# Patient Record
Sex: Female | Born: 1960 | Race: Black or African American | Hispanic: No | Marital: Single | Smoking: Never smoker
Health system: Southern US, Community
[De-identification: ages and names within clinical notes are randomized; demographics above are authoritative.]

## PROBLEM LIST (undated history)

## (undated) DIAGNOSIS — M1990 Unspecified osteoarthritis, unspecified site: Secondary | ICD-10-CM

## (undated) DIAGNOSIS — I10 Essential (primary) hypertension: Secondary | ICD-10-CM

## (undated) HISTORY — PX: SHOULDER SURGERY: SHX246

## (undated) HISTORY — DX: Essential (primary) hypertension: I10

## (undated) HISTORY — DX: Unspecified osteoarthritis, unspecified site: M19.90

---

## 1999-02-21 ENCOUNTER — Ambulatory Visit (HOSPITAL_COMMUNITY): Admission: RE | Admit: 1999-02-21 | Discharge: 1999-02-21 | Payer: Self-pay | Admitting: *Deleted

## 2002-01-15 ENCOUNTER — Emergency Department (HOSPITAL_COMMUNITY): Admission: EM | Admit: 2002-01-15 | Discharge: 2002-01-15 | Payer: Self-pay | Admitting: Emergency Medicine

## 2006-08-20 ENCOUNTER — Encounter: Admission: RE | Admit: 2006-08-20 | Discharge: 2006-09-23 | Payer: Self-pay | Admitting: Orthopedic Surgery

## 2006-10-15 ENCOUNTER — Encounter: Admission: RE | Admit: 2006-10-15 | Discharge: 2006-10-15 | Payer: Self-pay | Admitting: Obstetrics

## 2007-01-08 ENCOUNTER — Encounter: Admission: RE | Admit: 2007-01-08 | Discharge: 2007-02-26 | Payer: Self-pay | Admitting: Orthopedic Surgery

## 2007-05-14 ENCOUNTER — Encounter: Admission: RE | Admit: 2007-05-14 | Discharge: 2007-08-12 | Payer: Self-pay | Admitting: Orthopedic Surgery

## 2007-07-10 ENCOUNTER — Encounter: Admission: RE | Admit: 2007-07-10 | Discharge: 2007-07-30 | Payer: Self-pay | Admitting: Orthopedic Surgery

## 2007-08-01 ENCOUNTER — Encounter: Admission: RE | Admit: 2007-08-01 | Discharge: 2007-09-18 | Payer: Self-pay | Admitting: Orthopedic Surgery

## 2010-08-19 ENCOUNTER — Encounter: Payer: Self-pay | Admitting: Obstetrics & Gynecology

## 2010-08-20 ENCOUNTER — Encounter: Payer: Self-pay | Admitting: Obstetrics

## 2011-10-09 ENCOUNTER — Other Ambulatory Visit: Payer: Self-pay | Admitting: Physical Medicine and Rehabilitation

## 2011-10-09 DIAGNOSIS — G894 Chronic pain syndrome: Secondary | ICD-10-CM

## 2011-11-08 ENCOUNTER — Inpatient Hospital Stay: Admission: RE | Admit: 2011-11-08 | Payer: Self-pay | Source: Ambulatory Visit

## 2011-11-10 ENCOUNTER — Other Ambulatory Visit: Payer: Self-pay

## 2011-11-16 ENCOUNTER — Ambulatory Visit
Admission: RE | Admit: 2011-11-16 | Discharge: 2011-11-16 | Disposition: A | Discharge status: Home / Self Care | Payer: Medicaid Other | Source: Ambulatory Visit | Attending: Physical Medicine and Rehabilitation | Admitting: Physical Medicine and Rehabilitation

## 2011-11-16 DIAGNOSIS — G894 Chronic pain syndrome: Secondary | ICD-10-CM

## 2012-01-03 DIAGNOSIS — D509 Iron deficiency anemia, unspecified: Secondary | ICD-10-CM | POA: Insufficient documentation

## 2012-01-16 DIAGNOSIS — I10 Essential (primary) hypertension: Secondary | ICD-10-CM | POA: Insufficient documentation

## 2012-04-07 ENCOUNTER — Other Ambulatory Visit: Payer: Self-pay | Admitting: Orthopedic Surgery

## 2012-04-07 DIAGNOSIS — E041 Nontoxic single thyroid nodule: Secondary | ICD-10-CM

## 2012-04-10 ENCOUNTER — Ambulatory Visit
Admission: RE | Admit: 2012-04-10 | Discharge: 2012-04-10 | Disposition: A | Discharge status: Home / Self Care | Payer: Medicaid Other | Source: Ambulatory Visit | Attending: Orthopedic Surgery | Admitting: Orthopedic Surgery

## 2012-04-10 DIAGNOSIS — E041 Nontoxic single thyroid nodule: Secondary | ICD-10-CM

## 2013-01-15 DIAGNOSIS — M25511 Pain in right shoulder: Secondary | ICD-10-CM | POA: Insufficient documentation

## 2013-11-19 ENCOUNTER — Other Ambulatory Visit (HOSPITAL_COMMUNITY): Payer: Self-pay | Admitting: Orthopedic Surgery

## 2013-11-19 DIAGNOSIS — M7540 Impingement syndrome of unspecified shoulder: Secondary | ICD-10-CM

## 2014-07-15 ENCOUNTER — Emergency Department (HOSPITAL_COMMUNITY): Payer: Medicaid Other

## 2014-07-15 ENCOUNTER — Emergency Department (HOSPITAL_COMMUNITY)
Admission: EM | Admit: 2014-07-15 | Discharge: 2014-07-15 | Disposition: A | Discharge status: Home / Self Care | Payer: Medicaid Other | Attending: Emergency Medicine | Admitting: Emergency Medicine

## 2014-07-15 ENCOUNTER — Encounter (HOSPITAL_COMMUNITY): Payer: Self-pay | Admitting: Emergency Medicine

## 2014-07-15 DIAGNOSIS — W228XXA Striking against or struck by other objects, initial encounter: Secondary | ICD-10-CM | POA: Insufficient documentation

## 2014-07-15 DIAGNOSIS — Y9289 Other specified places as the place of occurrence of the external cause: Secondary | ICD-10-CM | POA: Insufficient documentation

## 2014-07-15 DIAGNOSIS — Y998 Other external cause status: Secondary | ICD-10-CM | POA: Diagnosis not present

## 2014-07-15 DIAGNOSIS — Y9389 Activity, other specified: Secondary | ICD-10-CM | POA: Diagnosis not present

## 2014-07-15 DIAGNOSIS — R609 Edema, unspecified: Secondary | ICD-10-CM

## 2014-07-15 DIAGNOSIS — S6992XA Unspecified injury of left wrist, hand and finger(s), initial encounter: Secondary | ICD-10-CM | POA: Insufficient documentation

## 2014-07-15 DIAGNOSIS — M25442 Effusion, left hand: Secondary | ICD-10-CM

## 2014-07-15 NOTE — ED Provider Notes (Signed)
CSN: [id number]     Arrival date & time 07/15/14  1834 History   First MD Initiated Contact with Patient 07/15/14 1859    This chart was scribed for non-physician practitioner, Kara MuttonMarissa Zasha Belleau, PA, working with Kara ChickMartha K Linker, MD by Kara Evans, ED Scribe. This patient was seen in room WTR7/WTR7 and the patient's care was started at 7:27 PM.  Chief Complaint  Patient presents with  . ring stuck on finger     since yesterday, unable to be removed, no injuries   HPI  PCP: No primary care provider on file. HPI Comments: Kara Evans is a 53 y.o. female with no known static and past medical history presenting to the emergency department with left ring finger swelling that started last night. Patient reports that she has a constant throbbing sensation identified to the left ring finger. Reported that she was trying to get her ring off secondary to the swelling but was unable to. Stated that she has tried cold water, oil, straining, dental floss without relief. Denied fall, injury, loss of sensation, changes to skin color.  PCP none  History reviewed. No pertinent past medical history. Past Surgical History  Procedure Laterality Date  . Shoulder surgery Right    No family history on file. History  Substance Use Topics  . Smoking status: Never Smoker   . Smokeless tobacco: Not on file  . Alcohol Use: Not on file   OB History    No data available     Review of Systems  Constitutional: Negative for fever and chills.  Musculoskeletal: Positive for joint swelling.       Swelling and pain of left ring finger  Neurological: Negative for numbness.  Psychiatric/Behavioral: Negative for confusion.   Allergies  Review of patient's allergies indicates no known allergies.  Home Medications   Prior to Admission medications   Not on File   Triage Vitals: BP 174/100 mmHg  Pulse 88  Temp(Src) 98.8 F (37.1 C) (Oral)  Resp 18  Ht 5\' 3"  (1.6 m)  Wt 154 lb (69.854 kg)  BMI 27.29  kg/m2  SpO2 98%  LMP 06/15/2014 Physical Exam  Constitutional: She is oriented to person, place, and time. She appears well-developed and well-nourished. No distress.  HENT:  Head: Normocephalic and atraumatic.  Mouth/Throat: Oropharynx is clear and moist. No oropharyngeal exudate.  Eyes: Conjunctivae and EOM are normal. Right eye exhibits no discharge. Left eye exhibits no discharge.  Neck: Normal range of motion. Neck supple. No tracheal deviation present.  Cardiovascular: Normal rate, regular rhythm and normal heart sounds.  Exam reveals no friction rub.   No murmur heard. Pulses:      Radial pulses are 2+ on the right side, and 2+ on the left side.  Pulmonary/Chest: Effort normal and breath sounds normal. No respiratory distress. She has no wheezes. She has no rales.  Musculoskeletal: Normal range of motion. She exhibits edema and tenderness.  Significant swelling identified to left ring finger with discomfort upon palpation to the base of the left ring finger circumferentially. Full range of motion identified with flexion extension the pain noted on examination. Negative erythema, ecchymosis, red streaks, drainage or bleeding noted. Negative signs of ischemia identified to the tissue.  Lymphadenopathy:    She has no cervical adenopathy.  Neurological: She is alert and oriented to person, place, and time. No cranial nerve deficit. She exhibits normal muscle tone. Coordination normal.  Cranial nerves III-XII grossly intact Strength 5+/5+ to upper extremities bilaterally with  resistance applied, equal distribution noted Strength intact to MCP, PIP, DIP joints of left hand Sensation intact with differentiation sharp and dull touch 2 point discrimination identified to the left hand  Fine motor skills intact  Skin: Skin is warm and dry. No rash noted. She is not diaphoretic. No erythema.  Psychiatric: She has a normal mood and affect. Her behavior is normal. Thought content normal.   Nursing note and vitals reviewed.   ED Course  Procedures (including critical care time) DIAGNOSTIC STUDIES: Oxygen Saturation is 98% on RA, nl by my interpretation.    COORDINATION OF CARE: 7:34 PM-Discussed treatment plan which includes possibly cutting the engagement ring and imaging with pt at bedside. Patient verbalizes understanding and agrees with treatment plan. Patient reported that it is okay to cut off the engagement ring.   Labs Review Labs Reviewed - No data to display  Imaging Review Dg Hand Complete Left  07/15/2014   CLINICAL DATA:  Left hand pain and fourth digit swelling  EXAM: LEFT HAND - COMPLETE 3+ VIEW  COMPARISON:  None.  FINDINGS: Significant soft tissue swelling in the fourth digit is noted. A ring is noted in place and by history the ring cannot be removed. No bony abnormality is seen.  IMPRESSION: Swelling of the fourth digit without acute bony abnormality.   Electronically Signed   By: Alcide CleverMark  Lukens M.D.   On: 07/15/2014 19:52     EKG Interpretation None      MDM   Final diagnoses:  Swelling  Finger joint swelling, left    Medications - No data to display  Filed Vitals:   07/15/14 1844  BP: 174/100  Pulse: 88  Temp: 98.8 F (37.1 C)  TempSrc: Oral  Resp: 18  Height: 5\' 3"  (1.6 m)  Weight: 154 lb (69.854 kg)  SpO2: 98%   I personally performed the services described in this documentation, which was scribed in my presence. The recorded information has been reviewed and is accurate.  Plain film of left hand noted swelling to the fourth digit without acute osseous injury. Significant swelling identified to left ring finger. Pulses palpable and strong. Negative signs of ischemia. Ring to the left ring finger removed by cutting-patient agreed to cutting ring. Instantaneous relief. When reassessed pulses palpable and strong, full range of motion identified to left ring finger, sensation still intact. Negative signs of ischemia. Doubt department  syndrome. Patient stable, afebrile. Patient septic appearing. Discharged patient. Discussed with patient to rest, ice, elevate. Referred to PCP and hand specialist. Discussed with patient to closely monitor symptoms and if symptoms are to worsen or change to report back to the ED - strict return instructions given.  Patient agreed to plan of care, understood, all questions answered.    Kara MuttonMarissa Tyniya Kuyper, PA-C 07/15/14 2022  Kara MuttonMarissa Tamala Manzer, PA-C 07/15/14 2023  Kara ChickMartha K Linker, MD 07/15/14 2026

## 2014-07-15 NOTE — ED Notes (Addendum)
Pt A+Ox4, reports ring stuck on L ring finger since last night, pt with swelling noted, denies injuries, pt reports attempting "everything" to get the ring off including ice/cold water, lotion/lubricant "and the string trick on youtube".  Speaking full sentences, rr even/un-lab.  Skin PWD.  NAD.

## 2014-07-15 NOTE — Discharge Instructions (Signed)
Please call your doctor for a followup appointment within 24-48 hours. When you talk to your doctor please let them know that you were seen in the emergency department and have them acquire all of your records so that they can discuss the findings with you and formulate a treatment plan to fully care for your new and ongoing problems. Please call and set up an appointment with your primary care provider Please follow-up with hand specialist Please rest, ice, elevate-fingers above nose Please continue to monitor symptoms closely and if symptoms are to worsen or change (fever greater than 101, chills, sweating, nausea, vomiting, chest pain, shortness of breathe, difficulty breathing, weakness, numbness, tingling, worsening or changes to pain pattern, fall, injury, changes to skin color of red/white/blue/black, red streaks, swelling increased, loss of sensation) please report back to the Emergency Department immediately.     Edema Edema is an abnormal buildup of fluids in your bodytissues. Edema is somewhatdependent on gravity to pull the fluid to the lowest place in your body. That makes the condition more common in the legs and thighs (lower extremities). Painless swelling of the feet and ankles is common and becomes more likely as you get older. It is also common in looser tissues, like around your eyes.  When the affected area is squeezed, the fluid may move out of that spot and leave a dent for a few moments. This dent is called pitting.  CAUSES  There are many possible causes of edema. Eating too much salt and being on your feet or sitting for a long time can cause edema in your legs and ankles. Hot weather may make edema worse. Common medical causes of edema include:  Heart failure.  Liver disease.  Kidney disease.  Weak blood vessels in your legs.  Cancer.  An injury.  Pregnancy.  Some medications.  Obesity. SYMPTOMS  Edema is usually painless.Your skin may look swollen or  shiny.  DIAGNOSIS  Your health care provider may be able to diagnose edema by asking about your medical history and doing a physical exam. You may need to have tests such as X-rays, an electrocardiogram, or blood tests to check for medical conditions that may cause edema.  TREATMENT  Edema treatment depends on the cause. If you have heart, liver, or kidney disease, you need the treatment appropriate for these conditions. General treatment may include:  Elevation of the affected body part above the level of your heart.  Compression of the affected body part. Pressure from elastic bandages or support stockings squeezes the tissues and forces fluid back into the blood vessels. This keeps fluid from entering the tissues.  Restriction of fluid and salt intake.  Use of a water pill (diuretic). These medications are appropriate only for some types of edema. They pull fluid out of your body and make you urinate more often. This gets rid of fluid and reduces swelling, but diuretics can have side effects. Only use diuretics as directed by your health care provider. HOME CARE INSTRUCTIONS   Keep the affected body part above the level of your heart when you are lying down.   Do not sit still or stand for prolonged periods.   Do not put anything directly under your knees when lying down.  Do not wear constricting clothing or garters on your upper legs.   Exercise your legs to work the fluid back into your blood vessels. This may help the swelling go down.   Wear elastic bandages or support stockings to reduce  ankle swelling as directed by your health care provider.   Eat a low-salt diet to reduce fluid if your health care provider recommends it.   Only take medicines as directed by your health care provider. SEEK MEDICAL CARE IF:   Your edema is not responding to treatment.  You have heart, liver, or kidney disease and notice symptoms of edema.  You have edema in your legs that does not  improve after elevating them.   You have sudden and unexplained weight gain. SEEK IMMEDIATE MEDICAL CARE IF:   You develop shortness of breath or chest pain.   You cannot breathe when you lie down.  You develop pain, redness, or warmth in the swollen areas.   You have heart, liver, or kidney disease and suddenly get edema.  You have a fever and your symptoms suddenly get worse. MAKE SURE YOU:   Understand these instructions.  Will watch your condition.  Will get help right away if you are not doing well or get worse. Document Released: 07/16/2005 Document Revised: 11/30/2013 Document Reviewed: 05/08/2013 [facility] Patient Information 2015 [place], [place]. This information is not intended to replace advice given to you by your health care provider. Make sure you discuss any questions you have with your health care provider.   Emergency Department Resource Guide 1) Find a Doctor and Pay Out of Pocket Although you won't have to find out who is covered by your insurance plan, it is a good idea to ask around and get recommendations. You will then need to call the office and see if the doctor you have chosen will accept you as a new patient and what types of options they offer for patients who are self-pay. Some doctors offer discounts or will set up payment plans for their patients who do not have insurance, but you will need to ask so you aren't surprised when you get to your appointment.  2) Contact Your Local Health Department Not all health departments have doctors that can see patients for sick visits, but many do, so it is worth a call to see if yours does. If you don't know where your local health department is, you can check in your phone book. The CDC also has a tool to help you locate your state's health department, and many state websites also have listings of all of their local health departments.  3) Find a Walk-in Clinic If your illness is not likely to be very severe or  complicated, you may want to try a walk in clinic. These are popping up all over the country in pharmacies, drugstores, and shopping centers. They're usually staffed by nurse practitioners or physician assistants that have been trained to treat common illnesses and complaints. They're usually fairly quick and inexpensive. However, if you have serious medical issues or chronic medical problems, these are probably not your best option.  No Primary Care Doctor: - Call Health Connect at  [phone] - they can help you locate a primary care doctor that  accepts your insurance, provides certain services, etc. - Physician Referral Service- [phone]  Chronic Pain Problems: Organization         Address  Phone   Notes  [facility]  [phone] Patients need to be referred by their primary care doctor.   Medication Assistance: Organization         Address  Phone   Notes  [facility] [address] [place], [place] [id number] [phone])  [phone] --Must be a resident of [place] °-- Must have NO insurance coverage whatsoever (no Medicaid/ Medicare, etc.) °-- The pt. MUST have a primary care doctor that directs their care regularly and follows them in the community °  °[facility]  (866) 331-1348   °United Way  (888) 892-1162   ° °Agencies that provide inexpensive medical care: °Organization         Address  Phone   Notes  °[facility]  (336) 832-8035   °[facility]    (336) 832-7272   °[facility] [address] °[place], [place] [address] (336) 832-4777   °[facility] [address], °[place] (336) 271-4999   °[facility]    (336) 373-0678   °[facility]    (336) 272-1050   °[facility] ° [address], [place] Phone:  (336) 832-4444, Fax:  (336) [phone] Hours of Operation:  9 am - 6 pm, M-F.  Also accepts  Medicaid/Medicare and self-pay.  °[facility] ° [address], [place] Phone: (336) [phone], Fax: ([phone]) [phone]. Hours of Operation:  8:30 am - 5:30 pm, M-F.  Also accepts Medicaid and self-pay.  °[facility] [place] [address], [place] Phone: (336) 878-6027   °[facility] [address], [place], [place] (336)723-1848, Ext. 123 Mondays & Thursdays: 7-9 AM.  First 15 patients are seen on a first come, first serve basis. °  ° °Medicaid-accepting [place] Providers: ° °Organization         Address  Phone   Notes  °[facility] [address], [place] ([phone]) [phone] Also accepts self-pay patients.  °[facility] [address], [place] ° (336) [phone]   °[facility] [address], [place] (336) 288-8857   °[facility] [address] [place] [address], [place] (336) 299-7000   °Veita Bland [address], [place]  ° (336) [phone] Only accepts [place] Access Medicaid patients after they have their name applied to their card.  ° °Self-Pay (no insurance) in [place]: ° °Organization         Address  Phone   Notes  °Sickle Cell Patients, [facility] [address], [place] (336) 832-1970   °[facility] [address], [place] (336) 832-4400   ° [facility] ° [address], [place] (336) 992-4800   °[facility]/Dr. Osei-Bonsu ° [address] [place] [address], [place] or [address], [place] (336) [phone] Phone number for both [place] and [place] locations is the same.  °[facility] [address], [place] (336) 299-0000   °[facility] [address] [place] [address], [place] or [address] (336) 852-7530 °(336) 878-2260   °[facility] [address], [place] (336)  350-1642, phone; (336) [phone], fax Sees patients 1st and 3rd Saturday of every month.  Must not qualify for public or private insurance (i.e. Medicaid, Medicare, [place] Health Choice, Veterans' Benefits) • Household income should be no more than 200% of the poverty level •The clinic cannot treat you if you are pregnant or think you are pregnant • Sexually transmitted diseases are not treated at the clinic.  ° ° °Dental Care: °Organization           Address  Phone  Notes  °[facility] [facility] [address], [place] (336) 641-6152 Accepts children up to age 21 who are enrolled in Medicaid or [place] Health Choice; pregnant women with a Medicaid card; and children who have applied for Medicaid or [place] Health Choice, but were declined, whose parents can pay a reduced fee at time of service.  °[facility] [place]  [address], [place] (336) 641-7733 Accepts children up to age 21 who are enrolled in Medicaid or [place] Health Choice; pregnant women with a Medicaid card; and children who have applied for Medicaid or [place] Health Choice, but were declined, whose parents can pay a reduced fee at time of service.  °[facility] ° [address], [place] (336) 641-4533 Patients are seen by appointment only. Walk-ins are not accepted. [facility] will see patients 18 years of age and older. °Monday - Tuesday (8am-5pm) °Most Wednesdays (8:30-5pm) °$30 per visit, cash only  °[facility] ° [address], [place] (336) 641-4533 Patients are seen by appointment only. Walk-ins are not accepted. [facility] will see patients 18 years of age and older. °One Wednesday Evening (Monthly: Volunteer Based).  $30 per visit, cash only  °[facility]  (919) 537-3737 for adults; Children under age 4, call Graduate Pediatric Dentistry at ([phone]) [phone]. Children aged  4-14, please call ([phone]) [phone] to request a pediatric application. ° Dental services are provided in all areas of dental care including fillings, crowns and bridges, complete and partial dentures, implants, gum treatment, root canals, and extractions. Preventive care is also provided. Treatment is provided to both adults and children. °Patients are selected via a lottery and there is often a waiting list. °  °[facility] [address], °[place] ° (336) 763-8833 [email or web address] °  °[facility] [address], [place], [place] (336)723-1848, Ext. 123 Second and Fourth Thursday of each month, opens at 6:30 AM; Clinic ends at 9 AM.  Patients are seen on a first-come first-served basis, and a limited number are seen during each clinic.  ° °[facility] ° [address], [place], [place] (336) 723-7904   Eligibility Requirements °You must have lived in [place], [place], or [place] counties for at least the last three months. °  You cannot be eligible for state or federal sponsored healthcare insurance, including Veterans Administration, Medicaid, or Medicare. °  You generally cannot be eligible for healthcare insurance through your employer.  °  How to apply: °Eligibility screenings are held every Tuesday and Wednesday afternoon from 1:00 pm until 4:00 pm. You do not need an appointment for the interview!  °[facility] [address], [place], [place] 336-631-2330   °[facility]  336-342-8273   °[facility]  336-703-3100   °[facility]  336-570-6415   ° °Behavioral Health Resources in the Community: °Intensive Outpatient Programs °Organization         Address  Phone  Notes  °[facility] [address], [place], [place] 336-878-6098   °[facility] [address], [place], [place] 336-832-9800   °[facility] [address],  [place], [place] ° 336-882-2125   °[facility] [address],  °[place], [place] 1-800-853-5163 or 336-641-4981   °Substance Abuse Resources °  Organization         Address  Phone  Notes  °[facility]  336-882-2125   °[facility]  336-784-9470   °[facility]  336-285-9073   °[facility]  336-845-3988   °Residential & Outpatient Substance Abuse Program  1-800-659-3381   °Psychological Services °Organization         Address  Phone  Notes  °[facility]  336- 832-9600   °[facility]  336- 378-7881   °[facility] [address], [place] 1-800-853-5163 or 336-641-4981   ° °Mobile Crisis Teams °Organization         Address  Phone  Notes  °[facility], Mobile Crisis Care Unit  1-877-626-1772   °Assertive °[facility] ° [address] [place], [place] 336-834-9664   °Sharon DeEsch [address] °[place] 336-554-5454   ° °Self-Help/Support Groups °Organization         Address  Phone             Notes  °[facility] [place] - variety of support groups  336- 373-1402 Call for more information  °Narcotics Anonymous (NA), [facility] [address], °[place]  2 meetings at this location  ° °Residential Treatment Programs °Organization         Address  Phone  Notes  °[facility] [address],    °[place]  1-866-801-8205   °[facility] ° [address], [place], [place] 704-293-8524   °[facility] [address], [place] 336-845-3988 Admissions: 8am-3pm M-F  °[facility] [address]    °[place], [place] 336-841-1104   °[facility] [address], [place], [place] 336-379-7146   °[facility] [address]  °[place], [place] 336-285-9073   °[facility] - Intensive Outpatient [address], [place], [place] 336-852-3033   °[facility]  ([facility] [address]  °[place], [place] 1-877-615-2722 or 336-784-9470   °[facility] [address] [place], [place] 336-227-7417 Accepts Medicaid  °[facility] [address]  ° [place] 1-800-659-3381 Substance Abuse/Addiction Treatment  ° °[place] Behavioral Health Resources °Organization         Address  Phone  Notes  °[facility]  (888) 581-9988   °Julie Brannon, PhD [address] [place], [place]   (336) 349-5553 or (336) 951-0000   ° Behavioral   [address] °[place], [place] (336) 349-4454   °[facility] [address], [place], [place] (336) 342-8316 Insurance/Medicaid/sponsorship through [facility]  °[facility] [address]                                    [place], [place] (336) 342-8316 Therapy/tele-psych/case  °[facility] [address]  ° [place], [place] (336) 349-2233    °Dr. Arfeen  (336) 349-4544   °[facility] [place]  [employer] [place] [facility] 1) [address], [place] °2) [address], [place] °3)  [address] [place] [address], [place] (336) 349-3220 °(336) 342-7768 ° °(336) 342-8140   °[place] Child Abuse Hotline (336) 342-1394 or (336) 342-3537 (After Hours)    ° ° ° °

## 2016-11-21 ENCOUNTER — Ambulatory Visit (INDEPENDENT_AMBULATORY_CARE_PROVIDER_SITE_OTHER): Payer: BLUE CROSS/BLUE SHIELD | Admitting: Family Medicine

## 2016-11-21 ENCOUNTER — Encounter: Payer: Self-pay | Admitting: Family Medicine

## 2016-11-21 VITALS — BP 164/110 | HR 92 | Temp 98.1°F | Resp 16 | Ht 63.0 in | Wt 176.0 lb

## 2016-11-21 DIAGNOSIS — I10 Essential (primary) hypertension: Secondary | ICD-10-CM | POA: Diagnosis not present

## 2016-11-21 DIAGNOSIS — G8929 Other chronic pain: Secondary | ICD-10-CM

## 2016-11-21 DIAGNOSIS — Z803 Family history of malignant neoplasm of breast: Secondary | ICD-10-CM | POA: Diagnosis not present

## 2016-11-21 DIAGNOSIS — Z23 Encounter for immunization: Secondary | ICD-10-CM

## 2016-11-21 DIAGNOSIS — Z124 Encounter for screening for malignant neoplasm of cervix: Secondary | ICD-10-CM

## 2016-11-21 DIAGNOSIS — Z1211 Encounter for screening for malignant neoplasm of colon: Secondary | ICD-10-CM

## 2016-11-21 DIAGNOSIS — D500 Iron deficiency anemia secondary to blood loss (chronic): Secondary | ICD-10-CM | POA: Diagnosis not present

## 2016-11-21 DIAGNOSIS — M25511 Pain in right shoulder: Secondary | ICD-10-CM | POA: Diagnosis not present

## 2016-11-21 DIAGNOSIS — Z0000 Encounter for general adult medical examination without abnormal findings: Secondary | ICD-10-CM

## 2016-11-21 MED ORDER — HYDROCHLOROTHIAZIDE 25 MG PO TABS: ORAL_TABLET | ORAL | 0 refills | Status: DC

## 2016-11-21 NOTE — Progress Notes (Signed)
Chief Complaint  Patient presents with  . Annual Exam    possible pap    Subjective:  Kara Evans is a 56 y.o. female here for a health maintenance visit.  Patient is new pt Family History of Breast Cancer in sister Pt reports that she has a strong family history of breast cancer in her MGM and her sister.  She reports that she does regular breast exams. Patient's last menstrual period was 06/15/2014.  She denies history of abnormal mammogram or any breast pain.  She has not had a mammogram in 3 years.  Uncontrolled Hypertension Pt also has a history of essential hypertension.  She took herself off all blood pressure medications.  She said "she just wanted to see if her blood pressure was that bad."  She reports that she was previously on lisinopril 2.60m.  She was never on hctz.  She denies side effects to ace inhibitor.  She does not remember if her blood pressure was every controlled.   Shoulder pain - Right Patient reports that she had right shoulder arthroscopy for right rotator cuff repair and reports that that she is having tightness in her right shoulder and scapula.  She feels like it hurts up to the side of her neck. She has not been back to [employer] 3  Or more years.  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  Iron Deficiency Anemia She has a history of iron deficiency anemia due to very heavy menstrual cycles. Her last hemoglobin was 8.0 back in 2013.  She reports that her last period was in 2015 and reports some occasional fatigue. Denies any hair loss or breaking nails. Denies palpitations.   Patient Active Problem List   Diagnosis Date Noted  . Family history of breast cancer in sister 11/21/2016  . Right shoulder pain 01/15/2013  . Essential hypertension 01/16/2012  . Iron deficiency anemia 01/03/2012    No past medical history on file.  Past Surgical History:  Procedure Laterality Date  . SHOULDER SURGERY Right      No  outpatient prescriptions prior to visit.   No facility-administered medications prior to visit.     Allergies  Allergen Reactions  . Penicillins Other (See Comments)    HIVES/ITCHING/SHORTNESS OF BRE     No family history on file.   Health Habits: Dental Exam: not up to date Eye Exam: up to date Exercise: 0 times/week on average Current exercise activities:occasionally walks Diet: balanced  Social History   Social History  . Marital status: Single    Spouse name: N/A  . Number of children: N/A  . Years of education: N/A   Occupational History  . Not on file.   Social History Main Topics  . Smoking status: Never Smoker  . Smokeless tobacco: Never Used  . Alcohol use No  . Drug use: Unknown  . Sexual activity: Not on file   Other Topics Concern  . Not on file   Social History Narrative  . No narrative on file   History  Alcohol Use No   History  Smoking Status  . Never Smoker  Smokeless Tobacco  . Never Used   History  Drug use: Unknown    GYN: Sexual Health Menstrual status: regular menses LMP: Patient's last menstrual period was 06/15/2014. Last pap smear: see HM section History of abnormal pap smears: none   Health Maintenance: See under health Maintenance activity for review of completion dates as well. Immunization  History  Administered Date(s) Administered  . Pneumococcal Polysaccharide-23 11/21/2016      Depression Screen-PHQ2/9 Depression screen PHQ 2/9 11/21/2016  Decreased Interest 0  Down, Depressed, Hopeless 0  PHQ - 2 Score 0       Depression Severity and Treatment Recommendations:  0-4= None  5-9= Mild / Treatment: Support, educate to call if worse; return in one month  10-14= Moderate / Treatment: Support, watchful waiting; Antidepressant or Psycotherapy  15-19= Moderately severe / Treatment: Antidepressant OR Psychotherapy  >= 20 = Major depression, severe / Antidepressant AND Psychotherapy    Review of Systems    Review of Systems  Constitutional: Negative for chills and fever.  HENT: Negative for hearing loss.   Eyes: Negative for blurred vision, double vision and photophobia.  Respiratory: Negative for cough, hemoptysis and sputum production.   Cardiovascular: Negative for chest pain, palpitations and orthopnea.  Gastrointestinal: Negative for abdominal pain, diarrhea, nausea and vomiting.  Genitourinary: Negative for dysuria, frequency and urgency.  Musculoskeletal: Positive for joint pain. Negative for back pain, myalgias and neck pain.  Skin: Negative for itching and rash.  Neurological: Negative for dizziness, tingling and headaches.  Psychiatric/Behavioral: Negative for depression. The patient is not nervous/anxious.     See HPI for ROS as well.    Objective:   Vitals:   11/21/16 1509  BP: (!) 164/110  Pulse: 92  Resp: 16  Temp: 98.1 F (36.7 C)  TempSrc: Oral  SpO2: 97%  Weight: 176 lb (79.8 kg)  Height: _0  (1.6 m)    Body mass index is 31.18 kg/m.  Physical Exam  Constitutional: She is oriented to person, place, and time. She appears well-developed and well-nourished.  HENT:  Head: Normocephalic and atraumatic.  Right Ear: External ear normal.  Left Ear: External ear normal.  Nose: Nose normal.  Mouth/Throat: Oropharynx is clear and moist.  Eyes: Conjunctivae and EOM are normal. Pupils are equal, round, and reactive to light. Right eye exhibits no discharge. Left eye exhibits no discharge.  Neck: Normal range of motion. Neck supple. No thyromegaly present.  Cardiovascular: Normal rate, regular rhythm, normal heart sounds and intact distal pulses.   No murmur heard. Pulmonary/Chest: Effort normal and breath sounds normal. No respiratory distress. She has no wheezes. She has no rales.  Abdominal: Soft. Bowel sounds are normal. She exhibits no distension. There is no tenderness. There is no rebound and no guarding.  Musculoskeletal: Normal range of motion. She  exhibits no edema.  Right shoulder with 2 scars Visibly lower than the left shoulder Tender to palpation at the [facility] joint Tight right trapezius muscles  Neurological: She is alert and oriented to person, place, and time. She has normal reflexes. No cranial nerve deficit.  Skin: Skin is warm. No erythema.  Psychiatric: She has a normal mood and affect. Her behavior is normal. Judgment and thought content normal.   Vaginal exam Labia normal bilaterally without skin lesions Urethral meatus normal appearing without erythema Vagina without discharge No CMT, ovaries small and not palpable Uterus midline, nontender Pap smear performed Chaperone present     Assessment/Plan:   Patient was seen for a health maintenance exam.  Counseled the patient on health maintenance issues. Reviewed her health mainteance schedule and ordered appropriate tests (see orders.) Counseled on regular exercise and weight management. Recommend regular eye exams and dental cleaning.   The following issues were addressed today for health maintenance:   Edwin was seen today for annual exam.  Diagnoses and all  orders for this visit:  Encounter for health maintenance examination in adult- age appropriate screenings reviewed -     Lipid panel -     Comprehensive metabolic panel -     HM MAMMOGRAPHY  Essential hypertension- discussed ALLHAT trial regarding diuretics in African Americans Will start HCTZ and if blood pressure is not controlled then will add lisinopril -     Microalbumin, urine -     Lipid panel -     Comprehensive metabolic panel  Iron deficiency anemia due to chronic blood loss- would anticipate improvement in hemoglobin since pt is now menopausal Plan to screen for colon cancer based on age but if pt continues to be anemic this is a valid workup -     CBC with Differential/Platelet  Need for prophylactic vaccination against Streptococcus pneumoniae (pneumococcus) -     Pneumococcal  polysaccharide vaccine 23-valent greater than or equal to 2yo subcutaneous/IM  Encounter for Papanicolaou smear for cervical cancer screening- discussed cervical cancer screening Explained that this is not a screen for ovarian cancer -     Pap IG, CT/NG NAA, and HPV (high risk)  Family history of breast cancer in sister -     HM MAMMOGRAPHY  Chronic right shoulder pain- advised pt to follow up with [facility] Discussed that physical therapy would be improve symptoms. She did not get PT after arthoscopy -     Ambulatory referral to Orthopedic Surgery  Screening for colon cancer- discussed colon cancer screening -     HM COLONOSCOPY  Other orders -     hydrochlorothiazide (HYDRODIURIL) 25 MG tablet; Take 1/2 tablet for a week then one tablet daily    Return in about 4 weeks (around 12/19/2016) for blood pressure check and to discuss labs.    Body mass index is 31.18 kg/m.:  Discussed the patient's BMI with patient. The BMI body mass index is 31.18 kg/m.     Future Appointments Date Time Provider State Line  12/19/2016 2:20 PM Forrest Moron, MD PCP-PCP [facility]    Patient Instructions       IF you received an x-ray today, you will receive an invoice from [facility]. Please contact [facility] at [phone] with questions or concerns regarding your invoice.   IF you received labwork today, you will receive an invoice from [facility]. Please contact [facility] at [phone] with questions or concerns regarding your invoice.   Our billing staff will not be able to assist you with questions regarding bills from these companies.  You will be contacted with the lab results as soon as they are available. The fastest way to get your results is to activate your My Chart account. Instructions are located on the last page of this paperwork. If you have not heard from [place] regarding the results in 2 weeks, please contact this office.      We  recommend that you schedule a mammogram for breast cancer screening. Typically, you do not need a referral to do this. Please contact a local imaging center to schedule your mammogram.  [facility] - [phone]  *ask for the Radiology Department [facility] ([place]) - [phone] or [phone]  [facility] - [phone] [place] [phone] [facility] - [phone]  *ask for the [facility] - [phone]  *ask for the Radiology Department [facility] - [phone]  *ask for the Mammography Department [facility]  [facility] - [phone] Health Maintenance, Female Adopting a healthy lifestyle and getting preventive care can go a long way to promote health and wellness. Talk with your health care provider about what schedule of regular examinations is right for you. This is a good chance for you to check in with your provider about disease prevention and staying healthy. In between checkups, there are plenty of things you can do on your own. Experts have done a lot of research about which lifestyle changes and preventive measures are most likely to keep you healthy. Ask your health care provider for more information. Weight and diet Eat a healthy diet  Be sure to include plenty of vegetables, fruits, low-fat dairy products, and lean protein.  Do not eat a lot of foods high in solid fats, added sugars, or salt.  Get regular exercise. This is one of the most important things you can do for your health.  Most adults should exercise for at least 150 minutes each week. The exercise should increase your heart rate and make you sweat (moderate-intensity exercise).  Most adults should also do strengthening exercises at least twice a week. This is in addition to the moderate-intensity exercise. Maintain a healthy weight  Body mass index (BMI) is a measurement that can  be used to identify possible weight problems. It estimates body fat based on height and weight. Your health care provider can help determine your BMI and help you achieve or maintain a healthy weight.  For females 34 years of age and older:  A BMI below 18.5 is considered underweight.  A BMI of 18.5 to 24.9 is normal.  A BMI of 25 to 29.9 is considered overweight.  A BMI of 30 and above is considered obese. Watch levels of cholesterol and blood lipids  You should start having your blood tested for lipids and cholesterol at 58 years of age, then have this test every 5 years.  You may need to have your cholesterol levels checked more often if:  Your lipid or cholesterol levels are high.  You are older than 58 years of age.  You are at high risk for heart disease. Cancer screening Lung Cancer  Lung cancer screening is recommended for adults 10-1 years old who are at high risk for lung cancer because of a history of smoking.  A yearly low-dose CT scan of the lungs is recommended for people who:  Currently smoke.  Have quit within the past 15 years.  Have at least a 30-pack-year history of smoking. A pack year is smoking an average of one pack of cigarettes a day for 1 year.  Yearly screening should continue until it has been 15 years since you quit.  Yearly screening should stop if you develop a health problem that would prevent you from having lung cancer treatment. Breast Cancer  Practice breast self-awareness. This means understanding how your breasts normally appear and feel.  It also means doing regular breast self-exams. Let your health care provider know about any changes, no matter how small.  If you are in your 20s or 30s, you should have a clinical breast exam (CBE) by a health care provider every 1-3 years as part of a regular health exam.  If you are 59 or older, have a CBE every year. Also consider having a breast X-ray (mammogram) every year.  If you have a  family history of breast cancer, talk to your health care provider about genetic screening.  If you are at high  risk for breast cancer, talk to your health care provider about having an MRI and a mammogram every year.  Breast cancer gene (BRCA) assessment is recommended for women who have family members with BRCA-related cancers. BRCA-related cancers include:  Breast.  Ovarian.  Tubal.  Peritoneal cancers.  Results of the assessment will determine the need for genetic counseling and BRCA1 and BRCA2 testing. Cervical Cancer  Your health care provider may recommend that you be screened regularly for cancer of the pelvic organs (ovaries, uterus, and vagina). This screening involves a pelvic examination, including checking for microscopic changes to the surface of your cervix (Pap test). You may be encouraged to have this screening done every 3 years, beginning at age 70.  For women ages 39-65, health care providers may recommend pelvic exams and Pap testing every 3 years, or they may recommend the Pap and pelvic exam, combined with testing for human papilloma virus (HPV), every 5 years. Some types of HPV increase your risk of cervical cancer. Testing for HPV may also be done on women of any age with unclear Pap test results.  Other health care providers may not recommend any screening for nonpregnant women who are considered low risk for pelvic cancer and who do not have symptoms. Ask your health care provider if a screening pelvic exam is right for you.  If you have had past treatment for cervical cancer or a condition that could lead to cancer, you need Pap tests and screening for cancer for at least 20 years after your treatment. If Pap tests have been discontinued, your risk factors (such as having a new sexual partner) need to be reassessed to determine if screening should resume. Some women have medical problems that increase the chance of getting cervical cancer. In these cases, your health  care provider may recommend more frequent screening and Pap tests. Colorectal Cancer  This type of cancer can be detected and often prevented.  Routine colorectal cancer screening usually begins at 58 years of age and continues through 58 years of age.  Your health care provider may recommend screening at an earlier age if you have risk factors for colon cancer.  Your health care provider may also recommend using home test kits to check for hidden blood in the stool.  A small camera at the end of a tube can be used to examine your colon directly (sigmoidoscopy or colonoscopy). This is done to check for the earliest forms of colorectal cancer.  Routine screening usually begins at age 57.  Direct examination of the colon should be repeated every 5-10 years through 58 years of age. However, you may need to be screened more often if early forms of precancerous polyps or small growths are found. Skin Cancer  Check your skin from head to toe regularly.  Tell your health care provider about any new moles or changes in moles, especially if there is a change in a mole's shape or color.  Also tell your health care provider if you have a mole that is larger than the size of a pencil eraser.  Always use sunscreen. Apply sunscreen liberally and repeatedly throughout the day.  Protect yourself by wearing long sleeves, pants, a wide-brimmed hat, and sunglasses whenever you are outside. Heart disease, diabetes, and high blood pressure  High blood pressure causes heart disease and increases the risk of stroke. High blood pressure is more likely to develop in:  People who have blood pressure in the high end of the normal range (  130-139/85-89 mm Hg).  People who are overweight or obese.  People who are African American.  If you are 79-55 years of age, have your blood pressure checked every 3-5 years. If you are 41 years of age or older, have your blood pressure checked every year. You should have  your blood pressure measured twice-once when you are at a hospital or clinic, and once when you are not at a hospital or clinic. Record the average of the two measurements. To check your blood pressure when you are not at a hospital or clinic, you can use:  An automated blood pressure machine at a pharmacy.  A home blood pressure monitor.  If you are between 20 years and 100 years old, ask your health care provider if you should take aspirin to prevent strokes.  Have regular diabetes screenings. This involves taking a blood sample to check your fasting blood sugar level.  If you are at a normal weight and have a low risk for diabetes, have this test once every three years after 58 years of age.  If you are overweight and have a high risk for diabetes, consider being tested at a younger age or more often. Preventing infection Hepatitis B  If you have a higher risk for hepatitis B, you should be screened for this virus. You are considered at high risk for hepatitis B if:  You were born in a country where hepatitis B is common. Ask your health care provider which countries are considered high risk.  Your parents were born in a high-risk country, and you have not been immunized against hepatitis B (hepatitis B vaccine).  You have HIV or AIDS.  You use needles to inject street drugs.  You live with someone who has hepatitis B.  You have had sex with someone who has hepatitis B.  You get hemodialysis treatment.  You take certain medicines for conditions, including cancer, organ transplantation, and autoimmune conditions. Hepatitis C  Blood testing is recommended for:  Everyone born from [birth year] through 1967.  Anyone with known risk factors for hepatitis C. Sexually transmitted infections (STIs)  You should be screened for sexually transmitted infections (STIs) including gonorrhea and chlamydia if:  You are sexually active and are younger than 58 years of age.  You are older than  58 years of age and your health care provider tells you that you are at risk for this type of infection.  Your sexual activity has changed since you were last screened and you are at an increased risk for chlamydia or gonorrhea. Ask your health care provider if you are at risk.  If you do not have HIV, but are at risk, it may be recommended that you take a prescription medicine daily to prevent HIV infection. This is called pre-exposure prophylaxis (PrEP). You are considered at risk if:  You are sexually active and do not regularly use condoms or know the HIV status of your partner(s).  You take drugs by injection.  You are sexually active with a partner who has HIV. Talk with your health care provider about whether you are at high risk of being infected with HIV. If you choose to begin PrEP, you should first be tested for HIV. You should then be tested every 3 months for as long as you are taking PrEP. Pregnancy  If you are premenopausal and you may become pregnant, ask your health care provider about preconception counseling.  If you may become pregnant, take 400 to 800 micrograms (  mcg) of folic acid every day.  If you want to prevent pregnancy, talk to your health care provider about birth control (contraception). Osteoporosis and menopause  Osteoporosis is a disease in which the bones lose minerals and strength with aging. This can result in serious bone fractures. Your risk for osteoporosis can be identified using a bone density scan.  If you are 42 years of age or older, or if you are at risk for osteoporosis and fractures, ask your health care provider if you should be screened.  Ask your health care provider whether you should take a calcium or vitamin D supplement to lower your risk for osteoporosis.  Menopause may have certain physical symptoms and risks.  Hormone replacement therapy may reduce some of these symptoms and risks. Talk to your health care provider about whether  hormone replacement therapy is right for you. Follow these instructions at home:  Schedule regular health, dental, and eye exams.  Stay current with your immunizations.  Do not use any tobacco products including cigarettes, chewing tobacco, or electronic cigarettes.  If you are pregnant, do not drink alcohol.  If you are breastfeeding, limit how much and how often you drink alcohol.  Limit alcohol intake to no more than 1 drink per day for nonpregnant women. One drink equals 12 ounces of beer, 5 ounces of wine, or 1 ounces of hard liquor.  Do not use street drugs.  Do not share needles.  Ask your health care provider for help if you need support or information about quitting drugs.  Tell your health care provider if you often feel depressed.  Tell your health care provider if you have ever been abused or do not feel safe at home. This information is not intended to replace advice given to you by your health care provider. Make sure you discuss any questions you have with your health care provider. Document Released: 01/29/2011 Document Revised: 12/22/2015 Document Reviewed: 04/19/2015 Elsevier Interactive Patient Education  2017 [employer]  Cervical Cancer The cervix is the opening and bottom part of the uterus between the vagina and the uterus. Cervical cancer is a fairly common cancer. It occurs most often in women between the ages of 15 years and 66 years. Cells of the cervix act very much like skin cells. These cells are exposed to toxins, viruses, and bacteria that may cause abnormal changes. There are two kinds of cancers of the cervix:  Squamous cell carcinoma. This type of cancer starts in the flat or scale-like cells that line the cervix. Squamous cell carcinoma can develop from a sexually transmitted infection caused by the human papillomavirus (HPV).  Adenocarcinoma. This type of cervical cancer starts in glandular cells that line the cervix. What increases the  risk? The risk of getting cancer of the cervix is related to your lifestyle, sexual history, health, and immune system. Risks for cervical cancer include:  Having a sexually transmitted viral infection. These include:  Chlamydia.  Herpes.  HPV.  Becoming sexually active before age 81 years.  Having more than one sexual partner or having sex with someone who has more than one sexual partner.  Not using condoms with sexual partners.  Having had cancer of the vagina or vulva.  Having a sexual partner who has or had cancer of the penis or who has had a sexual partner with abnormal cervical cells (dysplasia) or cervical cancer.  Using oral contraceptives (also called birth control pills).  Smoking.  Having a weakened immune system. For example,  human immunodeficiency virus (HIV) or other immune deficiency disorders.  Being the daughter of a woman who took diethylstilbestrol (DES) during pregnancy.  Having a sister or mother who has had cancer of the cervix.  Being [place] American, Hispanic, Asian, or a woman from the [place].  A history of dysplasia of the cervix. What are the signs or symptoms? Symptoms are usually not present in the early stages of cervical cancer. Once the cancer invades the cervix and surrounding tissues, the woman may have:  Abnormal vaginal bleeding or menstrual bleeding that is longer or heavier than usual.  Bleeding after intercourse, douching, or a Pap test.  Vaginal bleeding following menopause.  Abnormal vaginal discharge.  Pelvic discomfort or pain.  An abnormal Pap test.  Pain during sexual intercourse. Symptoms of more advanced cervical cancer may include:  Loss of appetite or weight loss.  Tiredness (fatigue).  Back and leg pain.  Inability to control urination or bowel movements. How is this diagnosed? A pelvic exam and Pap test are done to diagnose the condition. If abnormalities are found during the exam or Pap test,  the Pap test may be repeated in 3 months, or your health care provider may do additional tests or procedures, such as:  A colposcopy. This is a procedure that uses a special microscope that allows the health care provider to magnify and closely examine the cells of the cervix, vagina, and vulva.  Cervical biopsies. This is a procedure where small tissue samples are taken from the cervix to be examined under a microscope by a specialist.  A cone biopsy. This is a procedure to test for or remove cancerous tissue. Other tests may be needed, including:  Cystoscopy.  Proctoscopy or sigmoidoscopy.  Ultrasound.  CT scan.  MRI.  Laparoscopy. There are different stages of cervical cancer:  Stage 0, carcinoma in situ (CIS)-This first stage of cancer is the last and most serious stage of dysplasia.  Stage I-This means the tumor is in the uterus and cervix only.  Stage II-This means the tumor has spread to the upper vagina. The cancer has spread beyond the uterus but not to the pelvic walls or lower third of the vagina.  Stage III-This means the tumor has invaded the side wall of the pelvis and the lower third of the vagina. If the tumor blocks the tubes that carry urine to the bladder (ureters), it may cause urine to back up and the kidneys to swell (hydronephrosis).  Stage IV-This means the tumor has spread to the rectum or bladder. In the later part of this stage, it has also spread to distant organs, like the lungs. How is this treated? Treatment options can include:  Cone biopsy to remove the cancerous tissue.  Removal of the entire uterus and cervix.  Removal of the uterus, cervix, upper vagina, lymph nodes, and surrounding tissue (modified radical hysterectomy). The ovaries may be left in place or removed.  Medicines to treat cancer.  A combination of surgery, radiation, and chemotherapy.  Biological response modifiers. These are substances that help strengthen your immune  system's fight against cancer or infection. They may be used in combination with chemotherapy. Follow these instructions at home:  Get a gynecology exam and Pap test once every year or as directed by your health care provider.  Get the HPV vaccine.  Do not smoke.  Do not have sexual intercourse until your health care provider says it is okay.  Use a condom every time you have sex.  Contact a health care provider if:  You have increased pelvic pain or pressure.  Your are becoming increasingly tired.  You have increased leg or back pain.  You have a fever.  You have abnormal bleeding or discharge.  You lose weight. Get help right away if:  You cannot urinate.  You have blood in your urine.  You have blood or pressure with a bowel movement.  You develop severe back, stomach, or pelvic pain. This information is not intended to replace advice given to you by your health care provider. Make sure you discuss any questions you have with your health care provider. Document Released: 07/16/2005 Document Revised: 12/28/2015 Document Reviewed: 01/07/2013 Elsevier Interactive Patient Education  2017 [employer].

## 2016-11-21 NOTE — Patient Instructions (Addendum)
IF you received an x-ray today, you will receive an invoice from [facility]. Please contact [facility] at [phone] with questions or concerns regarding your invoice.   IF you received labwork today, you will receive an invoice from [place]. Please contact [facility] at [phone] with questions or concerns regarding your invoice.   Our billing staff will not be able to assist you with questions regarding bills from these companies.  You will be contacted with the lab results as soon as they are available. The fastest way to get your results is to activate your My Chart account. Instructions are located on the last page of this paperwork. If you have not heard from [place] regarding the results in 2 weeks, please contact this office.      We recommend that you schedule a mammogram for breast cancer screening. Typically, you do not need a referral to do this. Please contact a local imaging center to schedule your mammogram.  [facility] - [phone]  *ask for the Radiology [place] ([place]) - [phone] or [phone]  [facility] - [phone] [place] [phone] [facility] - [phone]  *ask for the [facility] - [phone]  *ask for the Radiology Department [facility] - [phone]  *ask for the [place] - [phone] Health Maintenance, Female Adopting a healthy lifestyle and getting preventive care can go a long way to promote health and wellness. Talk with your health care provider about what schedule of regular examinations is right for you. This is a good chance for you to check in with your provider about disease prevention and staying healthy. In between checkups, there are plenty of things you can do on your own. Experts have done a lot of research about which lifestyle  changes and preventive measures are most likely to keep you healthy. Ask your health care provider for more information. Weight and diet Eat a healthy diet  Be sure to include plenty of vegetables, fruits, low-fat dairy products, and lean protein.  Do not eat a lot of foods high in solid fats, added sugars, or salt.  Get regular exercise. This is one of the most important things you can do for your health.  Most adults should exercise for at least 150 minutes each week. The exercise should increase your heart rate and make you sweat (moderate-intensity exercise).  Most adults should also do strengthening exercises at least twice a week. This is in addition to the moderate-intensity exercise. Maintain a healthy weight  Body mass index (BMI) is a measurement that can be used to identify possible weight problems. It estimates body fat based on height and weight. Your health care provider can help determine your BMI and help you achieve or maintain a healthy weight.  For females 63 years of age and older:  A BMI below 18.5 is considered underweight.  A BMI of 18.5 to 24.9 is normal.  A BMI of 25 to 29.9 is considered overweight.  A BMI of 30 and above is considered obese. Watch levels of cholesterol and blood lipids  You should start having your blood tested for lipids and cholesterol at 58 years of age, then have this test every 5 years.  You may need to have your cholesterol levels checked more often if:  Your lipid or cholesterol levels are high.  You are older than 58 years of  age.  Kara Evans are at high risk for heart disease. Cancer screening Lung Cancer  Lung cancer screening is recommended for adults 7-68 years old who are at high risk for lung cancer because of a history of smoking.  A yearly low-dose CT scan of the lungs is recommended for people who:  Currently smoke.  Have quit within the past 15 years.  Have at least a 30-pack-year history of smoking. A pack year  is smoking an average of one pack of cigarettes a day for 1 year.  Yearly screening should continue until it has been 15 years since you quit.  Yearly screening should stop if you develop a health problem that would prevent you from having lung cancer treatment. Breast Cancer  Practice breast self-awareness. This means understanding how your breasts normally appear and feel.  It also means doing regular breast self-exams. Let your health care provider know about any changes, no matter how small.  If you are in your 20s or 30s, you should have a clinical breast exam (CBE) by a health care provider every 1-3 years as part of a regular health exam.  If you are 17 or older, have a CBE every year. Also consider having a breast X-ray (mammogram) every year.  If you have a family history of breast cancer, talk to your health care provider about genetic screening.  If you are at high risk for breast cancer, talk to your health care provider about having an MRI and a mammogram every year.  Breast cancer gene (BRCA) assessment is recommended for women who have family members with BRCA-related cancers. BRCA-related cancers include:  Breast.  Ovarian.  Tubal.  Peritoneal cancers.  Results of the assessment will determine the need for genetic counseling and BRCA1 and BRCA2 testing. Cervical Cancer  Your health care provider may recommend that you be screened regularly for cancer of the pelvic organs (ovaries, uterus, and vagina). This screening involves a pelvic examination, including checking for microscopic changes to the surface of your cervix (Pap test). You may be encouraged to have this screening done every 3 years, beginning at age 43.  For women ages 66-65, health care providers may recommend pelvic exams and Pap testing every 3 years, or they may recommend the Pap and pelvic exam, combined with testing for human papilloma virus (HPV), every 5 years. Some types of HPV increase your risk  of cervical cancer. Testing for HPV may also be done on women of any age with unclear Pap test results.  Other health care providers may not recommend any screening for nonpregnant women who are considered low risk for pelvic cancer and who do not have symptoms. Ask your health care provider if a screening pelvic exam is right for you.  If you have had past treatment for cervical cancer or a condition that could lead to cancer, you need Pap tests and screening for cancer for at least 20 years after your treatment. If Pap tests have been discontinued, your risk factors (such as having a new sexual partner) need to be reassessed to determine if screening should resume. Some women have medical problems that increase the chance of getting cervical cancer. In these cases, your health care provider may recommend more frequent screening and Pap tests. Colorectal Cancer  This type of cancer can be detected and often prevented.  Routine colorectal cancer screening usually begins at 58 years of age and continues through 58 years of age.  Your health care provider may recommend screening at  an earlier age if you have risk factors for colon cancer.  Your health care provider may also recommend using home test kits to check for hidden blood in the stool.  A small camera at the end of a tube can be used to examine your colon directly (sigmoidoscopy or colonoscopy). This is done to check for the earliest forms of colorectal cancer.  Routine screening usually begins at age 108.  Direct examination of the colon should be repeated every 5-10 years through 58 years of age. However, you may need to be screened more often if early forms of precancerous polyps or small growths are found. Skin Cancer  Check your skin from head to toe regularly.  Tell your health care provider about any new moles or changes in moles, especially if there is a change in a mole's shape or color.  Also tell your health care provider if  you have a mole that is larger than the size of a pencil eraser.  Always use sunscreen. Apply sunscreen liberally and repeatedly throughout the day.  Protect yourself by wearing long sleeves, pants, a wide-brimmed hat, and sunglasses whenever you are outside. Heart disease, diabetes, and high blood pressure  High blood pressure causes heart disease and increases the risk of stroke. High blood pressure is more likely to develop in:  People who have blood pressure in the high end of the normal range (130-139/85-89 mm Hg).  People who are overweight or obese.  People who are African American.  If you are 69-33 years of age, have your blood pressure checked every 3-5 years. If you are 28 years of age or older, have your blood pressure checked every year. You should have your blood pressure measured twice-once when you are at a hospital or clinic, and once when you are not at a hospital or clinic. Record the average of the two measurements. To check your blood pressure when you are not at a hospital or clinic, you can use:  An automated blood pressure machine at a pharmacy.  A home blood pressure monitor.  If you are between 72 years and 87 years old, ask your health care provider if you should take aspirin to prevent strokes.  Have regular diabetes screenings. This involves taking a blood sample to check your fasting blood sugar level.  If you are at a normal weight and have a low risk for diabetes, have this test once every three years after 58 years of age.  If you are overweight and have a high risk for diabetes, consider being tested at a younger age or more often. Preventing infection Hepatitis B  If you have a higher risk for hepatitis B, you should be screened for this virus. You are considered at high risk for hepatitis B if:  You were born in a country where hepatitis B is common. Ask your health care provider which countries are considered high risk.  Your parents were born  in a high-risk country, and you have not been immunized against hepatitis B (hepatitis B vaccine).  You have HIV or AIDS.  You use needles to inject street drugs.  You live with someone who has hepatitis B.  You have had sex with someone who has hepatitis B.  You get hemodialysis treatment.  You take certain medicines for conditions, including cancer, organ transplantation, and autoimmune conditions. Hepatitis C  Blood testing is recommended for:  Everyone born from [birth year] through 1967.  Anyone with known risk factors for hepatitis C. Sexually  transmitted infections (STIs)  You should be screened for sexually transmitted infections (STIs) including gonorrhea and chlamydia if:  You are sexually active and are younger than 58 years of age.  You are older than 58 years of age and your health care provider tells you that you are at risk for this type of infection.  Your sexual activity has changed since you were last screened and you are at an increased risk for chlamydia or gonorrhea. Ask your health care provider if you are at risk.  If you do not have HIV, but are at risk, it may be recommended that you take a prescription medicine daily to prevent HIV infection. This is called pre-exposure prophylaxis (PrEP). You are considered at risk if:  You are sexually active and do not regularly use condoms or know the HIV status of your partner(s).  You take drugs by injection.  You are sexually active with a partner who has HIV. Talk with your health care provider about whether you are at high risk of being infected with HIV. If you choose to begin PrEP, you should first be tested for HIV. You should then be tested every 3 months for as long as you are taking PrEP. Pregnancy  If you are premenopausal and you may become pregnant, ask your health care provider about preconception counseling.  If you may become pregnant, take 400 to 800 micrograms (mcg) of folic acid every day.  If you  want to prevent pregnancy, talk to your health care provider about birth control (contraception). Osteoporosis and menopause  Osteoporosis is a disease in which the bones lose minerals and strength with aging. This can result in serious bone fractures. Your risk for osteoporosis can be identified using a bone density scan.  If you are 40 years of age or older, or if you are at risk for osteoporosis and fractures, ask your health care provider if you should be screened.  Ask your health care provider whether you should take a calcium or vitamin D supplement to lower your risk for osteoporosis.  Menopause may have certain physical symptoms and risks.  Hormone replacement therapy may reduce some of these symptoms and risks. Talk to your health care provider about whether hormone replacement therapy is right for you. Follow these instructions at home:  Schedule regular health, dental, and eye exams.  Stay current with your immunizations.  Do not use any tobacco products including cigarettes, chewing tobacco, or electronic cigarettes.  If you are pregnant, do not drink alcohol.  If you are breastfeeding, limit how much and how often you drink alcohol.  Limit alcohol intake to no more than 1 drink per day for nonpregnant women. One drink equals 12 ounces of beer, 5 ounces of wine, or 1 ounces of hard liquor.  Do not use street drugs.  Do not share needles.  Ask your health care provider for help if you need support or information about quitting drugs.  Tell your health care provider if you often feel depressed.  Tell your health care provider if you have ever been abused or do not feel safe at home. This information is not intended to replace advice given to you by your health care provider. Make sure you discuss any questions you have with your health care provider. Document Released: 01/29/2011 Document Revised: 12/22/2015 Document Reviewed: 04/19/2015 Elsevier Interactive Patient  Education  2017 [employer]  Cervical Cancer The cervix is the opening and bottom part of the uterus between the vagina and  the uterus. Cervical cancer is a fairly common cancer. It occurs most often in women between the ages of 14 years and 50 years. Cells of the cervix act very much like skin cells. These cells are exposed to toxins, viruses, and bacteria that may cause abnormal changes. There are two kinds of cancers of the cervix:  Squamous cell carcinoma. This type of cancer starts in the flat or scale-like cells that line the cervix. Squamous cell carcinoma can develop from a sexually transmitted infection caused by the human papillomavirus (HPV).  Adenocarcinoma. This type of cervical cancer starts in glandular cells that line the cervix. What increases the risk? The risk of getting cancer of the cervix is related to your lifestyle, sexual history, health, and immune system. Risks for cervical cancer include:  Having a sexually transmitted viral infection. These include:  Chlamydia.  Herpes.  HPV.  Becoming sexually active before age 61 years.  Having more than one sexual partner or having sex with someone who has more than one sexual partner.  Not using condoms with sexual partners.  Having had cancer of the vagina or vulva.  Having a sexual partner who has or had cancer of the penis or who has had a sexual partner with abnormal cervical cells (dysplasia) or cervical cancer.  Using oral contraceptives (also called birth control pills).  Smoking.  Having a weakened immune system. For example, human immunodeficiency virus (HIV) or other immune deficiency disorders.  Being the daughter of a woman who took diethylstilbestrol (DES) during pregnancy.  Having a sister or mother who has had cancer of the cervix.  Being [place] American, Hispanic, Asian, or a woman from the [place].  A history of dysplasia of the cervix. What are the signs or symptoms? Symptoms  are usually not present in the early stages of cervical cancer. Once the cancer invades the cervix and surrounding tissues, the woman may have:  Abnormal vaginal bleeding or menstrual bleeding that is longer or heavier than usual.  Bleeding after intercourse, douching, or a Pap test.  Vaginal bleeding following menopause.  Abnormal vaginal discharge.  Pelvic discomfort or pain.  An abnormal Pap test.  Pain during sexual intercourse. Symptoms of more advanced cervical cancer may include:  Loss of appetite or weight loss.  Tiredness (fatigue).  Back and leg pain.  Inability to control urination or bowel movements. How is this diagnosed? A pelvic exam and Pap test are done to diagnose the condition. If abnormalities are found during the exam or Pap test, the Pap test may be repeated in 3 months, or your health care provider may do additional tests or procedures, such as:  A colposcopy. This is a procedure that uses a special microscope that allows the health care provider to magnify and closely examine the cells of the cervix, vagina, and vulva.  Cervical biopsies. This is a procedure where small tissue samples are taken from the cervix to be examined under a microscope by a specialist.  A cone biopsy. This is a procedure to test for or remove cancerous tissue. Other tests may be needed, including:  Cystoscopy.  Proctoscopy or sigmoidoscopy.  Ultrasound.  CT scan.  MRI.  Laparoscopy. There are different stages of cervical cancer:  Stage 0, carcinoma in situ (CIS)-This first stage of cancer is the last and most serious stage of dysplasia.  Stage I-This means the tumor is in the uterus and cervix only.  Stage II-This means the tumor has spread to the upper vagina. The cancer  has spread beyond the uterus but not to the pelvic walls or lower third of the vagina.  Stage III-This means the tumor has invaded the side wall of the pelvis and the lower third of the vagina. If  the tumor blocks the tubes that carry urine to the bladder (ureters), it may cause urine to back up and the kidneys to swell (hydronephrosis).  Stage IV-This means the tumor has spread to the rectum or bladder. In the later part of this stage, it has also spread to distant organs, like the lungs. How is this treated? Treatment options can include:  Cone biopsy to remove the cancerous tissue.  Removal of the entire uterus and cervix.  Removal of the uterus, cervix, upper vagina, lymph nodes, and surrounding tissue (modified radical hysterectomy). The ovaries may be left in place or removed.  Medicines to treat cancer.  A combination of surgery, radiation, and chemotherapy.  Biological response modifiers. These are substances that help strengthen your immune system's fight against cancer or infection. They may be used in combination with chemotherapy. Follow these instructions at home:  Get a gynecology exam and Pap test once every year or as directed by your health care provider.  Get the HPV vaccine.  Do not smoke.  Do not have sexual intercourse until your health care provider says it is okay.  Use a condom every time you have sex. Contact a health care provider if:  You have increased pelvic pain or pressure.  Your are becoming increasingly tired.  You have increased leg or back pain.  You have a fever.  You have abnormal bleeding or discharge.  You lose weight. Get help right away if:  You cannot urinate.  You have blood in your urine.  You have blood or pressure with a bowel movement.  You develop severe back, stomach, or pelvic pain. This information is not intended to replace advice given to you by your health care provider. Make sure you discuss any questions you have with your health care provider. Document Released: 07/16/2005 Document Revised: 12/28/2015 Document Reviewed: 01/07/2013 Elsevier Interactive Patient Education  2017 [employer].

## 2016-11-22 LAB — LIPID PANEL
Chol/HDL Ratio: 3.5 ratio (ref 0.0–4.4)
Cholesterol, Total: 243 mg/dL — ABNORMAL HIGH (ref 100–199)
HDL: 69 mg/dL (ref >39)
LDL Calculated: 141 mg/dL — ABNORMAL HIGH (ref 0–99)
Triglycerides: 164 mg/dL — ABNORMAL HIGH (ref 0–149)
VLDL Cholesterol Cal: 33 mg/dL (ref 5–40)

## 2016-11-22 LAB — CBC WITH DIFFERENTIAL/PLATELET
Basophils Absolute: 0 10*3/uL (ref 0.0–0.2)
Basos: 1 %
EOS (ABSOLUTE): 0.1 10*3/uL (ref 0.0–0.4)
EOS: 2 %
Hematocrit: 41 % (ref 34.0–46.6)
Hemoglobin: 13.6 g/dL (ref 11.1–15.9)
Immature Grans (Abs): 0 10*3/uL (ref 0.0–0.1)
Immature Granulocytes: 0 %
LYMPHS: 53 %
Lymphocytes Absolute: 2.5 10*3/uL (ref 0.7–3.1)
MCH: 28.9 pg (ref 26.6–33.0)
MCHC: 33.2 g/dL (ref 31.5–35.7)
MCV: 87 fL (ref 79–97)
MONOS ABS: 0.4 10*3/uL (ref 0.1–0.9)
Monocytes: 8 %
Neutrophils Absolute: 1.6 10*3/uL (ref 1.4–7.0)
Neutrophils: 36 %
Platelets: 280 10*3/uL (ref 150–379)
RBC: 4.71 x10E6/uL (ref 3.77–5.28)
RDW: 15.3 % (ref 12.3–15.4)
WBC: 4.6 10*3/uL (ref 3.4–10.8)

## 2016-11-22 LAB — COMPREHENSIVE METABOLIC PANEL
ALBUMIN: 4.6 g/dL (ref 3.5–5.5)
ALT: 55 IU/L — ABNORMAL HIGH (ref 0–32)
AST: 56 IU/L — ABNORMAL HIGH (ref 0–40)
Albumin/Globulin Ratio: 1.2 (ref 1.2–2.2)
Alkaline Phosphatase: 159 IU/L — ABNORMAL HIGH (ref 39–117)
BUN / CREAT RATIO: 12 (ref 9–23)
BUN: 10 mg/dL (ref 6–24)
Bilirubin Total: 0.3 mg/dL (ref 0.0–1.2)
CO2: 26 mmol/L (ref 18–29)
CREATININE: 0.85 mg/dL (ref 0.57–1.00)
Calcium: 10.6 mg/dL — ABNORMAL HIGH (ref 8.7–10.2)
Chloride: 97 mmol/L (ref 96–106)
GFR, EST AFRICAN AMERICAN: 88 mL/min/{1.73_m2} (ref >59)
GFR, EST NON AFRICAN AMERICAN: 76 mL/min/{1.73_m2} (ref >59)
Globulin, Total: 3.9 g/dL (ref 1.5–4.5)
Glucose: 82 mg/dL (ref 65–99)
Potassium: 3.5 mmol/L (ref 3.5–5.2)
Sodium: 142 mmol/L (ref 134–144)
TOTAL PROTEIN: 8.5 g/dL (ref 6.0–8.5)

## 2016-11-22 LAB — MICROALBUMIN, URINE: Microalbumin, Urine: 27 ug/mL

## 2016-11-23 LAB — PAP IG, CT-NG NAA, HPV HIGH-RISK
Chlamydia, Nuc. Acid Amp: NEGATIVE
Gonococcus by Nucleic Acid Amp: NEGATIVE
HPV, HIGH-RISK: NEGATIVE
PAP SMEAR COMMENT: 0

## 2016-11-28 ENCOUNTER — Telehealth: Payer: Self-pay | Admitting: Family Medicine

## 2016-11-28 DIAGNOSIS — Z1211 Encounter for screening for malignant neoplasm of colon: Secondary | ICD-10-CM

## 2016-11-28 NOTE — Telephone Encounter (Signed)
Pt would like a referral for a colonoscopy. States was discussed in last OV with Dr Creta Levin

## 2016-12-03 NOTE — Telephone Encounter (Signed)
See referral

## 2016-12-04 NOTE — Telephone Encounter (Signed)
Pt also asking if something can be prescribed for her shoulder pain.

## 2016-12-04 NOTE — Telephone Encounter (Signed)
Its under orders, not referrals

## 2016-12-04 NOTE — Telephone Encounter (Signed)
Pt called following up on colonoscopy referral. I see note where referral was placed but did not see referral. Please advise. Thanks!

## 2016-12-11 NOTE — Telephone Encounter (Signed)
Can we get order changed to a referral? Thanks!

## 2016-12-13 ENCOUNTER — Ambulatory Visit (INDEPENDENT_AMBULATORY_CARE_PROVIDER_SITE_OTHER): Payer: BLUE CROSS/BLUE SHIELD | Admitting: Physician Assistant

## 2016-12-13 ENCOUNTER — Encounter: Payer: Self-pay | Admitting: Physician Assistant

## 2016-12-13 VITALS — BP 130/90 | HR 83 | Temp 97.6°F | Resp 16 | Ht 64.5 in | Wt 177.0 lb

## 2016-12-13 DIAGNOSIS — M62838 Other muscle spasm: Secondary | ICD-10-CM | POA: Diagnosis not present

## 2016-12-13 MED ORDER — MELOXICAM 7.5 MG PO TABS: 7.5000 mg | ORAL_TABLET | Freq: Every day | ORAL | 0 refills | Status: DC

## 2016-12-13 MED ORDER — METAXALONE 800 MG PO TABS: 400.0000 mg | ORAL_TABLET | Freq: Three times a day (TID) | ORAL | 1 refills | Status: DC | PRN

## 2016-12-13 NOTE — Patient Instructions (Signed)
     IF you received an x-ray today, you will receive an invoice from [facility]. Please contact [facility] at [phone] with questions or concerns regarding your invoice.   IF you received labwork today, you will receive an invoice from [facility]. Please contact [facility] at [phone] with questions or concerns regarding your invoice.   Our billing staff will not be able to assist you with questions regarding bills from these companies.  You will be contacted with the lab results as soon as they are available. The fastest way to get your results is to activate your My Chart account. Instructions are located on the last page of this paperwork. If you have not heard from us regarding the results in 2 weeks, please contact this office.     
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
## 2016-12-13 NOTE — Telephone Encounter (Signed)
I am in the office on Friday. Please find me to let me know what I need to change. On my end it is listed as [facility] to order a colonoscopy. Did you mean change it as a referral to GI?

## 2016-12-14 NOTE — Telephone Encounter (Signed)
Yes GI See pended order

## 2016-12-17 ENCOUNTER — Encounter: Payer: Self-pay | Admitting: Physician Assistant

## 2016-12-19 ENCOUNTER — Ambulatory Visit: Payer: BLUE CROSS/BLUE SHIELD | Admitting: Family Medicine

## 2016-12-26 ENCOUNTER — Encounter: Payer: Self-pay | Admitting: Family Medicine

## 2016-12-26 ENCOUNTER — Encounter: Payer: Self-pay | Admitting: Gastroenterology

## 2017-01-02 ENCOUNTER — Other Ambulatory Visit: Payer: Self-pay | Admitting: Physician Assistant

## 2017-01-02 DIAGNOSIS — M62838 Other muscle spasm: Secondary | ICD-10-CM

## 2017-01-04 ENCOUNTER — Ambulatory Visit (INDEPENDENT_AMBULATORY_CARE_PROVIDER_SITE_OTHER): Payer: BLUE CROSS/BLUE SHIELD | Admitting: Physician Assistant

## 2017-01-04 ENCOUNTER — Encounter: Payer: Self-pay | Admitting: Physician Assistant

## 2017-01-04 VITALS — BP 142/92 | HR 82 | Temp 97.9°F | Resp 16 | Ht 64.5 in | Wt 173.0 lb

## 2017-01-04 DIAGNOSIS — M62838 Other muscle spasm: Secondary | ICD-10-CM

## 2017-01-04 DIAGNOSIS — S46811A Strain of other muscles, fascia and tendons at shoulder and upper arm level, right arm, initial encounter: Secondary | ICD-10-CM

## 2017-01-04 MED ORDER — TRAMADOL HCL 50 MG PO TABS: 50.0000 mg | ORAL_TABLET | Freq: Three times a day (TID) | ORAL | 0 refills | Status: DC | PRN

## 2017-01-04 MED ORDER — DICLOFENAC SODIUM 75 MG PO TBEC: 75.0000 mg | DELAYED_RELEASE_TABLET | Freq: Two times a day (BID) | ORAL | 0 refills | Status: DC

## 2017-01-04 MED ORDER — METHOCARBAMOL 500 MG PO TABS: 500.0000 mg | ORAL_TABLET | Freq: Three times a day (TID) | ORAL | 0 refills | Status: DC

## 2017-01-04 NOTE — Patient Instructions (Signed)
     IF you received an x-ray today, you will receive an invoice from [facility]. Please contact  Radiology at [phone] with questions or concerns regarding your invoice.   IF you received labwork today, you will receive an invoice from [facility]. Please contact [facility] at [phone] with questions or concerns regarding your invoice.   Our billing staff will not be able to assist you with questions regarding bills from these companies.  You will be contacted with the lab results as soon as they are available. The fastest way to get your results is to activate your My Chart account. Instructions are located on the last page of this paperwork. If you have not heard from us regarding the results in 2 weeks, please contact this office.     

## 2017-01-04 NOTE — Telephone Encounter (Signed)
Seen today. 

## 2017-01-04 NOTE — Progress Notes (Signed)
   Rae MarMadonna Trapp Meegan  MRN: [id number] DOB: [birth year]  PCP: Doristine BosworthStallings, Zoe A, MD  Chief Complaint  Patient presents with  . Follow-up    shoulder pain     Subjective:  Pt presents to clinic for recheck of her muscle spasms.  For a few days the skelaxin helped but then the pain returned - she is having trouble sleeping and working due to the pain.  It just randomly spasms up and when it does she has a hard time moving her neck and arm due to the pain.  She has no paresthesias or numbness down her right arm.  No headaches. No weakness.  Review of Systems  Musculoskeletal: Negative for back pain, gait problem, joint swelling, myalgias, neck pain and neck stiffness.    Patient Active Problem List   Diagnosis Date Noted  . Family history of breast cancer in sister 11/21/2016  . Right shoulder pain 01/15/2013  . Essential hypertension 01/16/2012  . Iron deficiency anemia 01/03/2012    Current Outpatient Prescriptions on File Prior to Visit  Medication Sig Dispense Refill  . hydrochlorothiazide (HYDRODIURIL) 25 MG tablet Take 1/2 tablet for a week then one tablet daily 90 tablet 0  . zolpidem (AMBIEN) 5 MG tablet Take 5 mg by mouth at bedtime.  0   No current facility-administered medications on file prior to visit.     Allergies  Allergen Reactions  . Penicillins Other (See Comments)    HIVES/ITCHING/SHORTNESS OF BRE    Pt patients past, family and social history were reviewed and updated.   Objective:  BP (!) 142/92 (BP Location: Right Arm, Patient Position: Sitting, Cuff Size: Small)   Pulse 82   Temp 97.9 F (36.6 C) (Oral)   Resp 16   Ht 5' 4.5" (1.638 m)   Wt 173 lb (78.5 kg)   LMP 06/15/2014   SpO2 98%   BMI 29.24 kg/m   Physical Exam  Constitutional: She is oriented to person, place, and time and well-developed, well-nourished, and in no distress.  HENT:  Head: Normocephalic and atraumatic.  Right Ear: Hearing and external ear normal.  Left Ear:  Hearing and external ear normal.  Eyes: Conjunctivae are normal.  Neck: Normal range of motion.  Pulmonary/Chest: Effort normal.  Musculoskeletal:       Right shoulder: She exhibits spasm (trapezius TTP - several trigger points palpable). She exhibits normal range of motion, no tenderness, no bony tenderness, no swelling, no deformity and no pain.       Left shoulder: Normal.       Cervical back: She exhibits spasm (trapezius on the right). She exhibits normal range of motion.  Neurological: She is alert and oriented to person, place, and time. She has normal motor skills, normal sensation, normal strength and normal reflexes. Gait normal.  Skin: Skin is warm and dry.  Psychiatric: Mood, memory, affect and judgment normal.  Vitals reviewed.   Assessment and Plan :  Muscle spasm - Plan: methocarbamol (ROBAXIN) 500 MG tablet, Ambulatory referral to Physical Therapy  Strain of right trapezius muscle, initial encounter - Plan: diclofenac (VOLTAREN) 75 MG EC tablet, traMADol (ULTRAM) 50 MG tablet, Ambulatory referral to Physical Therapy   Switch muscle relaxers - pt continues to have problems - send to PT for modalities that will hopefully relief the spasms that at this time have likely become constant with intermittent worsening flairs.  Benny LennertSarah Jasiel Apachito PA-C  Primary Care at [facility] 01/04/2017 11:36 AM

## 2017-01-29 ENCOUNTER — Other Ambulatory Visit: Payer: Self-pay | Admitting: Physician Assistant

## 2017-01-29 DIAGNOSIS — M62838 Other muscle spasm: Secondary | ICD-10-CM

## 2017-01-29 NOTE — Telephone Encounter (Signed)
Please advise 

## 2017-02-05 ENCOUNTER — Telehealth: Payer: Self-pay | Admitting: Family Medicine

## 2017-02-05 NOTE — Telephone Encounter (Signed)
Pt called the office and stated that she can't take Methocarbamol (Robaxin) 500 mg. It caused her to have tongue numbness and itching. Pet pt symptoms subsided once she stopped it 2 days ago. Advised ED or urgent care if not better. She has since taken Lyrica that a friend had given her, and it "helped and her shoulder pain stopped". She would like to have a [facility] [place] at [phone].

## 2017-02-05 NOTE — Telephone Encounter (Signed)
Pt is needing to talk with someone regarding her shoulder pain she states that she can not take the robaxin that Maralyn SagoSarah called in for her on 01/29/17  Best number [phone]

## 2017-02-06 NOTE — Telephone Encounter (Signed)
Spoke with patient and advised that she needs to schedule an appointment. Transferred to front desk for an appointment.

## 2017-02-07 ENCOUNTER — Ambulatory Visit (INDEPENDENT_AMBULATORY_CARE_PROVIDER_SITE_OTHER): Payer: BLUE CROSS/BLUE SHIELD | Admitting: Family Medicine

## 2017-02-07 ENCOUNTER — Encounter: Payer: Self-pay | Admitting: Family Medicine

## 2017-02-07 ENCOUNTER — Ambulatory Visit (INDEPENDENT_AMBULATORY_CARE_PROVIDER_SITE_OTHER): Payer: BLUE CROSS/BLUE SHIELD

## 2017-02-07 ENCOUNTER — Encounter: Payer: Self-pay | Admitting: Internal Medicine

## 2017-02-07 VITALS — BP 95/67 | HR 78 | Resp 16 | Ht 64.5 in | Wt 171.8 lb

## 2017-02-07 DIAGNOSIS — M542 Cervicalgia: Secondary | ICD-10-CM | POA: Diagnosis not present

## 2017-02-07 DIAGNOSIS — S161XXA Strain of muscle, fascia and tendon at neck level, initial encounter: Secondary | ICD-10-CM | POA: Diagnosis not present

## 2017-02-07 DIAGNOSIS — M25511 Pain in right shoulder: Secondary | ICD-10-CM

## 2017-02-07 MED ORDER — PREGABALIN 50 MG PO CAPS: 50.0000 mg | ORAL_CAPSULE | Freq: Three times a day (TID) | ORAL | 1 refills | Status: DC
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
## 2017-02-07 NOTE — Telephone Encounter (Signed)
Agree 

## 2017-02-07 NOTE — Patient Instructions (Addendum)
Begin Lyrica 50 mg 1 capsule 3 times daily.  We will use this since it seems to have helped you when you used your friends.  Take acetaminophen 500 mg (Tylenol) 2 pills 2-3 times daily if needed for additional pain relief  If you continue having a lot of pain please return and get evaluated again to consider changes in dose and/or referral for physical therapy.  Try to exercise the right shoulder as much as you can.   Shoulder Range of Motion Exercises Shoulder range of motion (ROM) exercises are designed to keep the shoulder moving freely. They are often recommended for people who have shoulder pain. Phase 1 exercises When you are able, do this exercise 5-6 days per week, or as told by your health care provider. Work toward doing 2 sets of 10 swings. Pendulum Exercise How To Do This Exercise Lying Down 1. Lie face-down on a bed with your abdomen close to the side of the bed. 2. Let your arm hang over the side of the bed. 3. Relax your shoulder, arm, and hand. 4. Slowly and gently swing your arm forward and back. Do not use your neck muscles to swing your arm. They should be relaxed. If you are struggling to swing your arm, have someone gently swing it for you. When you do this exercise for the first time, swing your arm at a 15 degree angle for 15 seconds, or swing your arm 10 times. As pain lessens over time, increase the angle of the swing to 30-45 degrees. 5. Repeat steps 1-4 with the other arm.  How To Do This Exercise While Standing 1. Stand next to a sturdy chair or table and hold on to it with your hand. 1. Bend forward at the waist. 2. Bend your knees slightly. 3. Relax your other arm and let it hang limp. 4. Relax the shoulder blade of the arm that is hanging and let it drop. 5. While keeping your shoulder relaxed, use body motion to swing your arm in small circles. The first time you do this exercise, swing your arm for about 30 seconds or 10 times. When you do it next time,  swing your arm for a little longer. 6. Stand up tall and relax. 7. Repeat steps 1-7, this time changing the direction of the circles. 2. Repeat steps 1-8 with the other arm.  Phase 2 exercises Do these exercises 3-4 times per day on 5-6 days per week or as told by your health care provider. Work toward holding the stretch for 20 seconds. Stretching Exercise 1 1. Lift your arm straight out in front of you. 2. Bend your arm 90 degrees at the elbow (right angle) so your forearm goes across your body and looks like the letter "L." 3. Use your other arm to gently pull the elbow forward and across your body. 4. Repeat steps 1-3 with the other arm. Stretching Exercise 2 You will need a towel or rope for this exercise. 1. Bend one arm behind your back with the palm facing outward. 2. Hold a towel with your other hand. 3. Reach the arm that holds the towel above your head, and bend that arm at the elbow. Your wrist should be behind your neck. 4. Use your free hand to grab the free end of the towel. 5. With the higher hand, gently pull the towel up behind you. 6. With the lower hand, pull the towel down behind you. 7. Repeat steps 1-6 with the other arm.  Phase 3 exercises Do each of these exercises at four different times of day (sessions) every day or as told by your health care provider. To begin with, repeat each exercise 5 times (repetitions). Work toward doing 3 sets of 12 repetitions or as told by your health care provider. Strengthening Exercise 1 You will need a light weight for this activity. As you grow stronger, you may use a heavier weight. 1. Standing with a weight in your hand, lift your arm straight out to the side until it is at the same height as your shoulder. 2. Bend your arm at 90 degrees so that your fingers are pointing to the ceiling. 3. Slowly raise your hand until your arm is straight up in the air. 4. Repeat steps 1-3 with the other arm.  Strengthening Exercise 2 You  will need a light weight for this activity. As you grow stronger, you may use a heavier weight. 1. Standing with a weight in your hand, gradually move your straight arm in an arc, starting at your side, then out in front of you, then straight up over your head. 2. Gradually move your other arm in an arc, starting at your side, then out in front of you, then straight up over your head. 3. Repeat steps 1-2 with the other arm.  Strengthening Exercise 3 You will need an elastic band for this activity. As you grow stronger, gradually increase the size of the bands or increase the number of bands that you use at one time. 1. While standing, hold an elastic band in one hand and raise that arm up in the air. 2. With your other hand, pull down the band until that hand is by your side. 3. Repeat steps 1-2 with the other arm.  This information is not intended to replace advice given to you by your health care provider. Make sure you discuss any questions you have with your health care provider. Document Released: 04/14/2003 Document Revised: 03/11/2016 Document Reviewed: 07/12/2014 Elsevier Interactive Patient Education  2018 [employer].     IF you received an x-ray today, you will receive an invoice from [facility]. Please contact [facility] at [phone] with questions or concerns regarding your invoice.   IF you received labwork today, you will receive an invoice from [facility]. Please contact [facility] at [phone] with questions or concerns regarding your invoice.   Our billing staff will not be able to assist you with questions regarding bills from these companies.  You will be contacted with the lab results as soon as they are available. The fastest way to get your results is to activate your My Chart account. Instructions are located on the last page of this paperwork. If you have not heard from [place] regarding the results in 2 weeks, please contact this office.

## 2017-02-12 ENCOUNTER — Encounter: Payer: Medicaid Other | Admitting: Internal Medicine

## 2017-02-22 ENCOUNTER — Ambulatory Visit: Payer: BLUE CROSS/BLUE SHIELD | Admitting: Family Medicine

## 2017-02-25 ENCOUNTER — Other Ambulatory Visit: Payer: Self-pay | Admitting: Family Medicine

## 2017-02-26 ENCOUNTER — Ambulatory Visit (INDEPENDENT_AMBULATORY_CARE_PROVIDER_SITE_OTHER): Payer: BLUE CROSS/BLUE SHIELD | Admitting: Family Medicine

## 2017-02-26 ENCOUNTER — Encounter: Payer: Self-pay | Admitting: Family Medicine

## 2017-02-26 VITALS — BP 130/93 | HR 92 | Temp 98.1°F | Resp 17 | Ht 64.5 in | Wt 172.0 lb

## 2017-02-26 DIAGNOSIS — L03012 Cellulitis of left finger: Secondary | ICD-10-CM | POA: Diagnosis not present

## 2017-02-26 MED ORDER — CHLORHEXIDINE GLUCONATE 4 % EX LIQD: Freq: Every day | CUTANEOUS | 0 refills | Status: DC | PRN

## 2017-02-26 MED ORDER — DOXYCYCLINE HYCLATE 100 MG PO TABS: 100.0000 mg | ORAL_TABLET | Freq: Two times a day (BID) | ORAL | 0 refills | Status: DC

## 2017-02-26 NOTE — Patient Instructions (Addendum)
   IF you received an x-ray today, you will receive an invoice from [facility]. Please contact [facility] at [phone] with questions or concerns regarding your invoice.   IF you received labwork today, you will receive an invoice from [facility]. Please contact [facility] at [phone] with questions or concerns regarding your invoice.   Our billing staff will not be able to assist you with questions regarding bills from these companies.  You will be contacted with the lab results as soon as they are available. The fastest way to get your results is to activate your My Chart account. Instructions are located on the last page of this paperwork. If you have not heard from us regarding the results in 2 weeks, please contact this office.    Paronychia Paronychia is an infection of the skin that surrounds a nail. It usually affects the skin around a fingernail, but it may also occur near a toenail. It often causes pain and swelling around the nail. This condition may come on suddenly or develop over a longer period. In some cases, a collection of pus (abscess) can form near or under the nail. Usually, paronychia is not serious and it clears up with treatment. What are the causes? This condition may be caused by bacteria or fungi. It is commonly caused by either Streptococcus or Staphylococcus bacteria. The bacteria or fungi often cause the infection by getting into the affected area through an opening in the skin, such as a cut or a hangnail. What increases the risk? This condition is more likely to develop in:  People who get their hands wet often, such as those who work as dishwashers, bartenders, or nurses.  People who bite their fingernails or suck their thumbs.  People who trim their nails too short.  People who have hangnails or injured fingertips.  People who get manicures.  People who have diabetes.  What are the signs or symptoms? Symptoms of this condition  include:  Redness and swelling of the skin near the nail.  Tenderness around the nail when you touch the area.  Pus-filled bumps under the cuticle. The cuticle is the skin at the base or sides of the nail.  Fluid or pus under the nail.  Throbbing pain in the area.  How is this diagnosed? This condition is usually diagnosed with a physical exam. In some cases, a sample of pus may be taken from an abscess to be tested in a lab. This can help to determine what type of bacteria or fungi is causing the condition. How is this treated? Treatment for this condition depends on the cause and severity of the condition. If the condition is mild, it may clear up on its own in a few days. Your health care provider may recommend soaking the affected area in warm water a few times a day. When treatment is needed, the options may include:  Antibiotic medicine, if the condition is caused by a bacterial infection.  Antifungal medicine, if the condition is caused by a fungal infection.  Incision and drainage, if an abscess is present. In this procedure, the health care provider will cut open the abscess so the pus can drain out.  Follow these instructions at home:  Soak the affected area in warm water if directed to do so by your health care provider. You may be told to do this for 20 minutes, 2-3 times a day. Keep the area dry in between soakings.  Take medicines only as directed by   your health care provider.  If you were prescribed an antibiotic medicine, finish all of it even if you start to feel better.  Keep the affected area clean.  Do not try to drain a fluid-filled bump yourself.  If you will be washing dishes or performing other tasks that require your hands to get wet, wear rubber gloves. You should also wear gloves if your hands might come in contact with irritating substances, such as cleaners or chemicals.  Follow your health care provider's instructions about: ? Wound care. ? Bandage  (dressing) changes and removal. Contact a health care provider if:  Your symptoms get worse or do not improve with treatment.  You have a fever or chills.  You have redness spreading from the affected area.  You have continued or increased fluid, blood, or pus coming from the affected area.  Your finger or knuckle becomes swollen or is difficult to move. This information is not intended to replace advice given to you by your health care provider. Make sure you discuss any questions you have with your health care provider. Document Released: 01/09/2001 Document Revised: 12/22/2015 Document Reviewed: 06/23/2014 Elsevier Interactive Patient Education  2018 [employer]vier Inc.  

## 2017-02-26 NOTE — Progress Notes (Signed)
Chief Complaint  Patient presents with  . Finger Injury    HPI Pt reports that she removed her gel nail polish She states that she had a fungal nail  Reports that she had the nail cut down but now she has pain in the ring finger on the left hand She is left handed  No past medical history on file.  Current Outpatient Prescriptions  Medication Sig Dispense Refill  . diclofenac (VOLTAREN) 75 MG EC tablet Take 1 tablet (75 mg total) by mouth 2 (two) times daily. 60 tablet 0  . hydrochlorothiazide (HYDRODIURIL) 25 MG tablet Take 1 tablet (25 mg total) by mouth daily. Office visit needed for 90 day supply 30 tablet 0  . methocarbamol (ROBAXIN) 500 MG tablet TAKE 1 TABLET BY MOUTH THREE TIMES A DAY 60 tablet 0  . pregabalin (LYRICA) 50 MG capsule Take 1 capsule (50 mg total) by mouth 3 (three) times daily. 90 capsule 1  . traMADol (ULTRAM) 50 MG tablet Take 1 tablet (50 mg total) by mouth every 8 (eight) hours as needed. 30 tablet 0  . zolpidem (AMBIEN) 5 MG tablet Take 5 mg by mouth at bedtime.  0  . chlorhexidine (HIBICLENS) 4 % external liquid Apply topically daily as needed. 120 mL 0  . doxycycline (VIBRA-TABS) 100 MG tablet Take 1 tablet (100 mg total) by mouth 2 (two) times daily. 20 tablet 0   No current facility-administered medications for this visit.     Allergies:  Allergies  Allergen Reactions  . Penicillins Other (See Comments)    HIVES/ITCHING/SHORTNESS OF BRE  . Methocarbamol     Tongue numbness, itching    Past Surgical History:  Procedure Laterality Date  . SHOULDER SURGERY Right     Social History   Social History  . Marital status: Single    Spouse name: N/A  . Number of children: N/A  . Years of education: N/A   Occupational History  . medical coder    Social History Main Topics  . Smoking status: Never Smoker  . Smokeless tobacco: Never Used  . Alcohol use No  . Drug use: No  . Sexual activity: No   Other Topics Concern  . None   Social  History Narrative  . None    ROS Review of Systems See HPI Constitution: No fevers or chills No malaise No diaphoresis Skin: No rash or itching Eyes: no blurry vision, no double vision GU: no dysuria or hematuria Neuro: no dizziness or headaches  Objective: Vitals:   02/26/17 1218  BP: (!) 130/93  Pulse: 92  Resp: 17  Temp: 98.1 F (36.7 C)  TempSrc: Oral  SpO2: 98%  Weight: 172 lb (78 kg)  Height: 5' 4.5" (1.638 m)    Physical Exam  Constitutional: She is oriented to person, place, and time. She appears well-developed and well-nourished.  Pulmonary/Chest: Effort normal.  Neurological: She is alert and oriented to person, place, and time.  Psychiatric: She has a normal mood and affect. Her behavior is normal. Judgment and thought content normal.   Tender to palpation of the nail bed to palpation Dry nail noted    Assessment and Plan Kara Evans was seen today for finger injury.  Diagnoses and all orders for this visit:  Paronychia of left middle finger Discussed Hibiclens for cleaning and soaks then topical lotrimin with oral antibiotic with doxycycline -     doxycycline (VIBRA-TABS) 100 MG tablet; Take 1 tablet (100 mg total) by mouth 2 (two) times  daily. -     chlorhexidine (HIBICLENS) 4 % external liquid; Apply topically daily as needed.     Pike Scantlebury A Aggie Douse

## 2017-03-27 ENCOUNTER — Other Ambulatory Visit: Payer: Self-pay | Admitting: Family Medicine

## 2017-03-27 DIAGNOSIS — S161XXA Strain of muscle, fascia and tendon at neck level, initial encounter: Secondary | ICD-10-CM

## 2017-03-30 ENCOUNTER — Other Ambulatory Visit: Payer: Self-pay | Admitting: Family Medicine

## 2017-04-02 ENCOUNTER — Telehealth: Payer: Self-pay | Admitting: Family Medicine

## 2017-04-03 NOTE — Telephone Encounter (Signed)
Looks like this was refilled on 03/28/17.

## 2017-04-10 ENCOUNTER — Encounter: Payer: Medicaid Other | Admitting: Internal Medicine

## 2017-04-29 ENCOUNTER — Other Ambulatory Visit: Payer: Self-pay | Admitting: Family Medicine

## 2017-04-30 ENCOUNTER — Other Ambulatory Visit: Payer: Self-pay | Admitting: Family Medicine

## 2017-04-30 DIAGNOSIS — S161XXA Strain of muscle, fascia and tendon at neck level, initial encounter: Secondary | ICD-10-CM

## 2017-05-01 ENCOUNTER — Other Ambulatory Visit: Payer: Self-pay | Admitting: Family Medicine

## 2017-05-01 DIAGNOSIS — S161XXA Strain of muscle, fascia and tendon at neck level, initial encounter: Secondary | ICD-10-CM

## 2017-05-01 NOTE — Progress Notes (Signed)
Chief Complaint  Patient presents with  . left arm    x couple weeks, radiating up the arm and in hands, no tingling or numbness.  Pain level 10/10    HPI  Patient reports that she has been having radiating pain in left arm   No sweats, tremors It is helping with the shoulder pains Her numbness radiates from her elbow down to her wrist and shoots into her fingers She would like a refill of lyrica at an increased dose to help with her shooting pain since it is helping.  No past medical history on file.  Current Outpatient Prescriptions  Medication Sig Dispense Refill  . chlorhexidine (HIBICLENS) 4 % external liquid Apply topically daily as needed. 120 mL 0  . diclofenac (VOLTAREN) 75 MG EC tablet Take 1 tablet (75 mg total) by mouth 2 (two) times daily. 60 tablet 0  . doxycycline (VIBRA-TABS) 100 MG tablet Take 1 tablet (100 mg total) by mouth 2 (two) times daily. 20 tablet 0  . hydrochlorothiazide (HYDRODIURIL) 25 MG tablet TAKE 1 TABLET (25 MG TOTAL) BY MOUTH DAILY. OFFICE VISIT NEEDED FOR 90 DAY SUPPLY 30 tablet 0  . methocarbamol (ROBAXIN) 500 MG tablet TAKE 1 TABLET BY MOUTH THREE TIMES A DAY 60 tablet 0  . pregabalin (LYRICA) 100 MG capsule Take 1 capsule (100 mg total) by mouth 3 (three) times daily. 90 capsule 3  . traMADol (ULTRAM) 50 MG tablet Take 1 tablet (50 mg total) by mouth every 8 (eight) hours as needed. 30 tablet 0  . zolpidem (AMBIEN) 5 MG tablet Take 5 mg by mouth at bedtime.  0   No current facility-administered medications for this visit.     Allergies:  Allergies  Allergen Reactions  . Penicillins Other (See Comments)    HIVES/ITCHING/SHORTNESS OF BRE  . Methocarbamol     Tongue numbness, itching    Past Surgical History:  Procedure Laterality Date  . SHOULDER SURGERY Right     Social History   Social History  . Marital status: Single    Spouse name: N/A  . Number of children: N/A  . Years of education: N/A   Occupational History  .  medical coder    Social History Main Topics  . Smoking status: Never Smoker  . Smokeless tobacco: Never Used  . Alcohol use No  . Drug use: No  . Sexual activity: No   Other Topics Concern  . None   Social History Narrative  . None    ROS  Objective: Vitals:   05/02/17 1341  BP: 108/70  Pulse: 95  Resp: 16  Temp: 98.4 F (36.9 C)  TempSrc: Oral  SpO2: 97%  Weight: 174 lb (78.9 kg)  Height: 5' 4.5" (1.638 m)    Physical Exam  Constitutional: She is oriented to person, place, and time. She appears well-developed and well-nourished.  HENT:  Head: Normocephalic and atraumatic.  Cardiovascular: Normal rate, regular rhythm and normal heart sounds.   Pulmonary/Chest: Effort normal and breath sounds normal. No respiratory distress. She has no wheezes.  Musculoskeletal:       Left shoulder: She exhibits normal range of motion, no tenderness, no bony tenderness, no swelling, no effusion, no crepitus, no deformity, no laceration, no pain, no spasm, normal pulse and normal strength.       Cervical back: Normal. She exhibits normal range of motion, no tenderness and no bony tenderness.  Neurological: She is alert and oriented to person, place, and time.  Reflex  Scores:      Tricep reflexes are 2+ on the right side and 2+ on the left side.      Bicep reflexes are 2+ on the right side and 2+ on the left side.      Brachioradialis reflexes are 2+ on the right side and 2+ on the left side.   Assessment and Plan Kara Evans was seen today for left arm.  Diagnoses and all orders for this visit:  Encounter for medication refill- refilled lyrica at an increase dose since it helps with radicular pain  Strain of cervical portion of right trapezius muscle -     pregabalin (LYRICA) 100 MG capsule; Take 1 capsule (100 mg total) by mouth 3 (three) times daily.  Other orders -     Cancel: Flu Vaccine QUAD 36+ mos IM -     Cancel: Tdap vaccine greater than or equal to 7yo IM     Nikkol Pai A  [employer]

## 2017-05-02 ENCOUNTER — Encounter: Payer: Self-pay | Admitting: Family Medicine

## 2017-05-02 ENCOUNTER — Ambulatory Visit (INDEPENDENT_AMBULATORY_CARE_PROVIDER_SITE_OTHER): Payer: BLUE CROSS/BLUE SHIELD | Admitting: Family Medicine

## 2017-05-02 VITALS — BP 108/70 | HR 95 | Temp 98.4°F | Resp 16 | Ht 64.5 in | Wt 174.0 lb

## 2017-05-02 DIAGNOSIS — S161XXA Strain of muscle, fascia and tendon at neck level, initial encounter: Secondary | ICD-10-CM

## 2017-05-02 DIAGNOSIS — Z760 Encounter for issue of repeat prescription: Secondary | ICD-10-CM

## 2017-05-02 MED ORDER — PREGABALIN 100 MG PO CAPS: 100.0000 mg | ORAL_CAPSULE | Freq: Three times a day (TID) | ORAL | 3 refills | Status: DC

## 2017-05-02 NOTE — Patient Instructions (Signed)
     IF you received an x-ray today, you will receive an invoice from [facility]. Please contact [facility] at [phone] with questions or concerns regarding your invoice.   IF you received labwork today, you will receive an invoice from [facility]. Please contact [facility] at [phone] with questions or concerns regarding your invoice.   Our billing staff will not be able to assist you with questions regarding bills from these companies.  You will be contacted with the lab results as soon as they are available. The fastest way to get your results is to activate your My Chart account. Instructions are located on the last page of this paperwork. If you have not heard from us regarding the results in 2 weeks, please contact this office.     

## 2017-05-06 NOTE — Telephone Encounter (Signed)
dont' see the medication needing refill. Must have been addressed already.

## 2017-05-15 NOTE — Telephone Encounter (Signed)
Patient needs to be seen if she wants refills.  Recommend she get established with a regular doctor.  Apologize that I had missed this message 2 weeks ago.

## 2017-06-03 ENCOUNTER — Other Ambulatory Visit: Payer: Self-pay | Admitting: Family Medicine

## 2017-06-04 ENCOUNTER — Telehealth: Payer: Self-pay | Admitting: Family Medicine

## 2017-06-04 DIAGNOSIS — S161XXA Strain of muscle, fascia and tendon at neck level, initial encounter: Secondary | ICD-10-CM

## 2017-06-04 NOTE — Telephone Encounter (Signed)
Attempted to call pt. to further discuss symptoms.

## 2017-06-04 NOTE — Telephone Encounter (Signed)
Phone call to the pt. to obtain more information about her left shoulder symptoms.  Stated the increase in Lyrica to 3 x/ day helped for a short time, after appt. 10/4.  Now, c/o a "burning sensation and intermittent sharp pain in left shoulder that comes and goes."  Reported the swelling of area is the same.  Stated she doesn't have the usual mobility, due to the amount of pain in left shoulder.  Describes the pain as "unbearable."  Denied any numbness/ tingling of left upper extremity.  Advised will relay this information to Dr. Eldred MangesStalling for further recommendations.  Pt. agreed with plan.

## 2017-06-04 NOTE — Telephone Encounter (Signed)
Copied from CRM #[id number]. >> Jun 04, 2017 10:28 AM Raquel SarnaHayes, Teresa G wrote: Increased sholder pain, and getting worse and having trouble sleeping as well. Lyrica 100mg  and asking if it can be increased

## 2017-06-05 ENCOUNTER — Ambulatory Visit: Payer: Self-pay | Admitting: *Deleted

## 2017-06-05 NOTE — Telephone Encounter (Signed)
Please advise 

## 2017-06-05 NOTE — Telephone Encounter (Signed)
Pt called in c/o severe pain in right shoulder and muscle spasms in top muscle of shoulder.  She has tried heat, Ibuprofen and Aleve without any relief.   She is on Lyrica 100mg  for the pain but it is not helping at all.   She called yesterday and talked with a [facility] triage nurse.  I called the [place] office and spoke with Jonny RuizJohn of the nurse pool for Dr. Creta LevinStallings.  I told him the situation of pt being in severe pain and wanting an increase in her Lyrica dose.   The message has been routed to Dr. Creta LevinStallings and just waiting on his response.   Pt informed of this but became very upset that "It's taking this long to get an increase in my Lyrica dose for this shoulder pain"   I offered to make her an appt to be seen today since she is having such severe pain in the shoulder however she refused to let me do that at this time.    [place] and can't drive in to do that today".  I offered the option of going to the urgent care and she stated,   "I'm not going to the urgent care".   "I can't believe it's taking this long to get an increase in my Lyrica dose".   "I'm very unhappy with this [place] Office every since I started going there"    "I always have to wait at least 30 minutes every time I'm there even with an appt"   "This is crazy that it's taking this long to get my Lyrica increased".   "I will just call you  Back later".    I apologized to her for the trouble she has experienced and offered again to make her an appt however she again said I will "Call you back"     Reason for Disposition . [1] MODERATE pain (e.g., interferes with normal activities) AND [2] present > 3 days  Answer Assessment - Initial Assessment Questions 1. ONSET: "When did the pain start?"     A week 2. LOCATION: "Where is the pain located?"     Right shoulder 3. PAIN: "How bad is the pain?" (Scale 1-10; or mild, moderate, severe)   - MILD (1-3): doesn't interfere with normal activities   - MODERATE (4-7): interferes  with normal activities (e.g., work or school) or awakens from sleep   - SEVERE (8-10): excruciating pain, unable to do any normal activities, unable to move arm at all due to pain     10 hurts all the time even if not moving 4. WORK OR EXERCISE: "Has there been any recent work or exercise that involved this part of the body?"     No 5. CAUSE: "What do you think is causing the shoulder pain?"     I'm not sure. 6. OTHER SYMPTOMS: "Do you have any other symptoms?" (e.g., neck pain, swelling, rash, fever, numbness, weakness)     Possible from neck.   Top muscle is spasms.  7. PREGNANCY: "Is there any chance you are pregnant?" "When was your last menstrual period?"  Protocols used: SHOULDER PAIN-A-AH

## 2017-06-21 MED ORDER — PREGABALIN 200 MG PO CAPS: 200.0000 mg | ORAL_CAPSULE | Freq: Three times a day (TID) | ORAL | 3 refills | Status: DC

## 2017-06-21 NOTE — Telephone Encounter (Signed)
Please let the patient know that her lyrica dose was increased to lyrica 200mg  tid  She should schedule an office visit for January to follow up on this issue but if the lyrica stops working she should come in sooner.   Please phone in Lyrica. To the pharmacy.

## 2017-06-21 NOTE — Telephone Encounter (Signed)
Attempted to call new Lyrica Rx to pharmacy.  Pharmacist states pt already has 200mg  TID from another provider, a Dr. Ethelene Halamos.

## 2017-07-25 ENCOUNTER — Ambulatory Visit: Payer: BLUE CROSS/BLUE SHIELD | Admitting: Family Medicine

## 2017-07-27 ENCOUNTER — Ambulatory Visit: Payer: BLUE CROSS/BLUE SHIELD | Admitting: Family Medicine

## 2017-07-27 ENCOUNTER — Encounter: Payer: Self-pay | Admitting: Family Medicine

## 2017-07-27 VITALS — BP 102/72 | HR 74 | Temp 98.3°F | Resp 18 | Ht 64.0 in | Wt 176.0 lb

## 2017-07-27 DIAGNOSIS — M62838 Other muscle spasm: Secondary | ICD-10-CM

## 2017-07-27 DIAGNOSIS — S46811D Strain of other muscles, fascia and tendons at shoulder and upper arm level, right arm, subsequent encounter: Secondary | ICD-10-CM

## 2017-07-27 DIAGNOSIS — S46811A Strain of other muscles, fascia and tendons at shoulder and upper arm level, right arm, initial encounter: Secondary | ICD-10-CM

## 2017-07-27 MED ORDER — TRAMADOL HCL 50 MG PO TABS: 50.0000 mg | ORAL_TABLET | Freq: Three times a day (TID) | ORAL | 0 refills | Status: AC | PRN

## 2017-07-27 NOTE — Progress Notes (Signed)
  Chief Complaint  Patient presents with  . Shoulder Pain    Right, muscle spasms    HPI  Patient reports that she was having some spasms in the right shoulder She states that started having in November She tried heat, ibuprofen, alleve  She is on lyrica 100mg  for her pain but that is not helping    No past medical history on file.  Current Outpatient Medications  Medication Sig Dispense Refill  . hydrochlorothiazide (HYDRODIURIL) 25 MG tablet TAKE 1 TABLET (25 MG TOTAL) BY MOUTH DAILY. OFFICE VISIT NEEDED FOR 90 DAY SUPPLY 30 tablet 3  . pregabalin (LYRICA) 200 MG capsule Take 1 capsule (200 mg total) by mouth 3 (three) times daily. 90 capsule 3   No current facility-administered medications for this visit.     Allergies:  Allergies  Allergen Reactions  . Penicillins Other (See Comments)    HIVES/ITCHING/SHORTNESS OF BRE  . Methocarbamol     Tongue numbness, itching    Past Surgical History:  Procedure Laterality Date  . SHOULDER SURGERY Right     Social History   Socioeconomic History  . Marital status: Single    Spouse name: None  . Number of children: None  . Years of education: None  . Highest education level: None  Social Needs  . Financial resource strain: None  . Food insecurity - worry: None  . Food insecurity - inability: None  . Transportation needs - medical: None  . Transportation needs - non-medical: None  Occupational History  . Occupation: Energy managermedical coder  Tobacco Use  . Smoking status: Never Smoker  . Smokeless tobacco: Never Used  Substance and Sexual Activity  . Alcohol use: No  . Drug use: No  . Sexual activity: No  Other Topics Concern  . None  Social History Narrative  . None    No family history on file.   ROS Review of Systems See HPI Constitution: No fevers or chills No malaise No diaphoresis Skin: No rash or itching Eyes: no blurry vision, no double vision GU: no dysuria or hematuria Neuro: no dizziness or  headaches * all others reviewed and negative   Objective: Vitals:   07/27/17 0935  BP: 102/72  Pulse: 74  Resp: 18  Temp: 98.3 F (36.8 C)  TempSrc: Oral  SpO2: 99%  Weight: 176 lb (79.8 kg)  Height: 5\' 4"  (1.626 m)    Physical Exam  Constitutional: She is oriented to person, place, and time. She appears well-developed and well-nourished.  HENT:  Head: Normocephalic and atraumatic.  Eyes: Conjunctivae and EOM are normal.  Cardiovascular: Normal rate, regular rhythm and normal heart sounds.  No murmur heard. Pulmonary/Chest: Effort normal and breath sounds normal. No stridor. No respiratory distress.  Musculoskeletal:       Cervical back: She exhibits no swelling, no edema, no deformity, no laceration, no pain, no spasm and normal pulse.       Back:  Neurological: She is alert and oriented to person, place, and time.  Skin: Skin is warm. Capillary refill takes less than 2 seconds.  Psychiatric: She has a normal mood and affect. Her behavior is normal. Judgment and thought content normal.    Assessment and Plan Tawni CarnesMadonna was seen today for shoulder pain.  Diagnoses and all orders for this visit:  Muscle spasm  Strain of right trapezius muscle, subsequent encounter  advised physical therapy for continued  Advised muscle relaxer and heat Tramadol for pain  Babs Dabbs A Creta LevinStallings

## 2017-07-27 NOTE — Patient Instructions (Addendum)
[facility] [phone] Please call Julieanne CottonJennifer Auman, Office Manager to set up an appointment Office Hours: Weekdays [address] [place], [place] [address] (Inside the [facility])      IF you received an x-ray today, you will receive an invoice from [facility]. Please contact [facility] at [phone] with questions or concerns regarding your invoice.   IF you received labwork today, you will receive an invoice from [facility]. Please contact [facility] at [phone] with questions or concerns regarding your invoice.   Our billing staff will not be able to assist you with questions regarding bills from these companies.  You will be contacted with the lab results as soon as they are available. The fastest way to get your results is to activate your My Chart account. Instructions are located on the last page of this paperwork. If you have not heard from us regarding the results in 2 weeks, please contact this office.     Muscle Cramps and Spasms Muscle cramps and spasms occur when a muscle or muscles tighten and you have no control over this tightening (involuntary muscle contraction). They are a common problem and can develop in any muscle. The most common place is in the calf muscles of the leg. Muscle cramps and muscle spasms are both involuntary muscle contractions, but there are some differences between the two:  Muscle cramps are painful. They come and go and may last a few seconds to 15 minutes. Muscle cramps are often more forceful and last longer than muscle spasms.  Muscle spasms may or may not be painful. They may also last just a few seconds or much longer.  Certain medical conditions, such as diabetes or Parkinson disease, can make it more likely to develop cramps or spasms. However, cramps or spasms are usually not caused by a serious underlying problem. Common causes include:  Overexertion.  Overuse from repetitive motions, or doing  the same thing over and over.  Remaining in a certain position for a long period of time.  Improper preparation, form, or technique while playing a sport or doing an activity.  Dehydration.  Injury.  Side effects of some medicines.  Abnormally low levels of the salts and ions in your blood (electrolytes), especially potassium and calcium. This could happen if you are taking water pills (diuretics) or if you are pregnant.  In many cases, the cause of muscle cramps or spasms is unknown. Follow these instructions at home:  Stay well hydrated. Drink enough fluid to keep your urine clear or pale yellow.  Try massaging, stretching, and relaxing the affected muscle.  If directed, apply heat to tight or tense muscles as often as told by your health care provider. Use the heat source that your health care provider recommends, such as a moist heat pack or a heating pad. ? Place a towel between your skin and the heat source. ? Leave the heat on for 20-30 minutes. ? Remove the heat if your skin turns bright red. This is especially important if you are unable to feel pain, heat, or cold. You may have a greater risk of getting burned.  If directed, put ice on the affected area. This may help if you are sore or have pain after a cramp or spasm. ? Put ice in a plastic bag. ? Place a towel between your skin and the bag. ? Leavethe ice on for 20 minutes, 2-3 times a day.  Take over-the-counter and prescription medicines only as told by your health  care provider.  Pay attention to any changes in your symptoms. Contact a health care provider if:  Your cramps or spasms get more severe or happen more often.  Your cramps or spasms do not improve over time. This information is not intended to replace advice given to you by your health care provider. Make sure you discuss any questions you have with your health care provider. Document Released: 01/05/2002 Document Revised: 08/17/2015 Document  Reviewed: 04/19/2015 Elsevier Interactive Patient Education  2018 [employer]
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
## 2017-08-05 ENCOUNTER — Telehealth: Payer: Self-pay | Admitting: Family Medicine

## 2017-08-05 DIAGNOSIS — Z1211 Encounter for screening for malignant neoplasm of colon: Secondary | ICD-10-CM

## 2017-08-05 NOTE — Telephone Encounter (Signed)
Copied from CRM [id number]. Topic: Referral - Request >> Aug 02, 2017  2:51 PM Gerrianne ScalePayne, Angela L wrote: Reason for CRM: patient request a colonoscopy referral   _______________________________________________________  Please advise. Thanks!

## 2017-08-09 NOTE — Telephone Encounter (Signed)
Referral placed.

## 2017-08-16 ENCOUNTER — Encounter: Payer: Self-pay | Admitting: Gastroenterology

## 2017-08-20 ENCOUNTER — Telehealth: Payer: Self-pay | Admitting: Family Medicine

## 2017-08-20 ENCOUNTER — Other Ambulatory Visit: Payer: Self-pay | Admitting: Family Medicine

## 2017-08-20 DIAGNOSIS — S161XXA Strain of muscle, fascia and tendon at neck level, initial encounter: Secondary | ICD-10-CM

## 2017-08-20 NOTE — Telephone Encounter (Signed)
Requesting refill of Lyrica  LOV 07/27/18 with Dr. Creta LevinStallings  Refill 06/21/17   #90

## 2017-08-20 NOTE — Telephone Encounter (Signed)
Copied from CRM [id number]. Topic: Quick Communication - See Telephone Encounter >> Aug 20, 2017  2:21 PM Windy KalataMichael, Christalynn [place] L, NT wrote: CRM for notification. See Telephone encounter for:  08/20/17.  Patient is needing a refill on her Ly[facility] on [address]

## 2017-08-20 NOTE — Telephone Encounter (Signed)
Pt is out of medication , pt needs this medication today.  Cb# [phone]

## 2017-08-21 ENCOUNTER — Telehealth: Payer: Self-pay | Admitting: Family Medicine

## 2017-08-21 ENCOUNTER — Telehealth: Payer: Self-pay

## 2017-08-21 MED ORDER — PREGABALIN 200 MG PO CAPS: 200.0000 mg | ORAL_CAPSULE | Freq: Three times a day (TID) | ORAL | 3 refills | Status: AC

## 2017-08-21 NOTE — Telephone Encounter (Signed)
°  Relation to pt: self  Call back number: [phone] [id number] [place], [place] - [address] [facility] [address]) [phone] (Phone) [phone] (Fax)     Reason for call:  Patient called very upset stating she's experiencing shoulder pain and would like pregabalin (LYRICA) 200 MG capsule sent in today, patient declined appointment due to weather conditions and would like to speak with a nurse today regarding her medication request, please advise

## 2017-08-21 NOTE — Telephone Encounter (Signed)
Refill on Lyrica. Please advise.   Copied from CRM [phone]. Topic: Quick Communication - See Telephone Encounter >> Aug 20, 2017  2:21 PM Windy KalataMichael, Taylor L, NT wrote: CRM for notification. See Telephone encounter for:  08/20/17. >> Aug 21, 2017  8:18 AM Percival SpanishKennedy, Cheryl W wrote:  Pt is calling back to follow up on her req for a refill on the below med, she said she is in a lot of pain and need this med today    pregabalin (LYRICA) 200 MG capsule

## 2017-08-21 NOTE — Telephone Encounter (Signed)
Please advise. dgaddy  

## 2017-08-21 NOTE — Telephone Encounter (Signed)
Copied from CRM [id number]. Topic: Quick Communication - See Telephone Encounter >> Aug 21, 2017  9:17 AM Waymon AmatoBurton, Donna F wrote: CRM for notification. See Telephone encounter for: pt is checking on status of her refill request for lyrica -she states that she is completely out and pt is in extreme pain with her shoulder to the point that the other shoulder is hurting as well  She states she is very upset with this and is considering leaving the practice because it is also difficult to get it filled each time  Best number  08/21/17.

## 2017-08-21 NOTE — Telephone Encounter (Signed)
Pt called checking status of Lyrica, contact pt or pharmacy if needed, pt states she is in bad pain

## 2017-08-21 NOTE — Telephone Encounter (Signed)
Adjusted dose to 200mg 

## 2017-08-22 NOTE — Telephone Encounter (Signed)
Resolved - see refill encounter.

## 2017-08-22 NOTE — Telephone Encounter (Signed)
Pt requesting a refill for Lyrica

## 2017-08-22 NOTE — Telephone Encounter (Signed)
Pt notified and verbalized understanding.

## 2017-08-22 NOTE — Telephone Encounter (Signed)
Resolved, see refill encounter.

## 2017-08-22 NOTE — Telephone Encounter (Addendum)
[facility]. Spoke with PharmD Verlee Monteora. Pregabalin 200mg  called in.   Phone call to patient, unable to reach. When patient calls back, please advise that [facility] [address] in [place].

## 2017-08-22 NOTE — Telephone Encounter (Signed)
Copied from CRM [phone]. Topic: Quick Communication - See Telephone Encounter >> Aug 22, 2017  8:21 AM Percival SpanishKennedy, Cheryl W wrote:  The below med was sent to the wrong pharmacy and pt need this med asap to the correct pharmacy    pregabalin (LYRICA) 200 MG capsule  [facility]  [address]

## 2017-08-22 NOTE — Telephone Encounter (Signed)
Pt is very upset medication was sent to the wrong pharmacy. [facility]

## 2017-08-22 NOTE — Telephone Encounter (Addendum)
Lyrica 200mg  has been [facility] on [address] in [place]. See refill encounter for further details.

## 2017-08-24 NOTE — Telephone Encounter (Signed)
Phone call from answering service. Answering service states that [facility] is [facility] to make provider aware [facility] [address] [facility] [address].   [facility] [address]. Spoke with [name]. She states Kara Evans comes in "religiously". She sees Dr. Ethelene Halamos for pain management. He has Kara Evans taking Lyrica 200mg  QID. Kara Evans last picked up Lyrica 200mg  on 12/30, before that 12/8. Back in November, [place] had called in 50mg  Lyrica. [facility] for refill, Trula OreChristina advised patient she had just got it [facility] on January. Kara Evans had denied she had ever picked up a medication there, however [facility] [address] has her drivers license on file for picking up Lyrical 200mg .   Provider, [employer].

## 2017-09-13 ENCOUNTER — Other Ambulatory Visit: Payer: Self-pay

## 2017-09-13 ENCOUNTER — Encounter: Payer: Self-pay | Admitting: Gastroenterology

## 2017-09-13 ENCOUNTER — Ambulatory Visit (AMBULATORY_SURGERY_CENTER): Payer: Self-pay

## 2017-09-13 VITALS — Ht 62.0 in | Wt 175.8 lb

## 2017-09-13 DIAGNOSIS — Z1211 Encounter for screening for malignant neoplasm of colon: Secondary | ICD-10-CM

## 2017-09-13 MED ORDER — PEG 3350-KCL-NA BICARB-NACL 420 G PO SOLR: 4000.0000 mL | Freq: Once | ORAL | 0 refills | Status: AC

## 2017-09-13 NOTE — Progress Notes (Signed)
No egg or soy allergy known to patient  No issues with past sedation with any surgeries  or procedures, no intubation problems  No diet pills per patient No home 02 use per patient  No blood thinners per patient  Pt denies issues with constipation  No A fib or A flutter  EMMI video sent to pt's e mail in pv

## 2017-09-23 ENCOUNTER — Telehealth: Payer: Self-pay | Admitting: Gastroenterology

## 2017-09-23 NOTE — Telephone Encounter (Signed)
Spoke to pt she verbalize she had drank red crystal light before she thought about it. Advised not to drink anymore red fluids and let the nurse know when she comes in for her procedure.

## 2017-09-24 ENCOUNTER — Ambulatory Visit (AMBULATORY_SURGERY_CENTER): Payer: BLUE CROSS/BLUE SHIELD | Admitting: Gastroenterology

## 2017-09-24 ENCOUNTER — Encounter: Payer: Self-pay | Admitting: Gastroenterology

## 2017-09-24 VITALS — BP 126/82 | HR 71 | Temp 99.6°F | Resp 17 | Ht 64.0 in | Wt 176.0 lb

## 2017-09-24 DIAGNOSIS — Z1212 Encounter for screening for malignant neoplasm of rectum: Secondary | ICD-10-CM

## 2017-09-24 DIAGNOSIS — Z1211 Encounter for screening for malignant neoplasm of colon: Secondary | ICD-10-CM

## 2017-09-24 DIAGNOSIS — Z538 Procedure and treatment not carried out for other reasons: Secondary | ICD-10-CM | POA: Diagnosis not present

## 2017-09-24 MED ORDER — SODIUM CHLORIDE 0.9 % IV SOLN: 500.0000 mL | Freq: Once | INTRAVENOUS | Status: AC

## 2017-09-24 NOTE — Progress Notes (Signed)
Spontaneous respirations throughout. VSS. Resting comfortably. To PACU on room air. Report to  RN. 

## 2017-09-24 NOTE — Op Note (Signed)
[facility] Patient Name: Kara Evans Procedure Date: 09/24/2017 10:46 AM MRN: [id number] Endoscopist: Rachael Fee , MD Age: 57 Referring MD:  Date of Birth: 1961-02-03 Gender: Female Account #: [id number] Procedure:                Colonoscopy Indications:              Screening for colorectal malignant neoplasm Medicines:                Monitored Anesthesia Care Procedure:                Pre-Anesthesia Assessment:                           - Prior to the procedure, a History and Physical                            was performed, and patient medications and                            allergies were reviewed. The patient's tolerance of                            previous anesthesia was also reviewed. The risks                            and benefits of the procedure and the sedation                            options and risks were discussed with the patient.                            All questions were answered, and informed consent                            was obtained. Prior Anticoagulants: The patient has                            taken no previous anticoagulant or antiplatelet                            agents. ASA Grade Assessment: II - A patient with                            mild systemic disease. After reviewing the risks                            and benefits, the patient was deemed in                            satisfactory condition to undergo the procedure.                           After obtaining informed consent, the colonoscope  was passed under direct vision. Throughout the                            procedure, the patient's blood pressure, pulse, and                            oxygen saturations were monitored continuously. The                            Colonoscope was introduced through the anus and                            advanced to the the cecum, identified by                            appendiceal orifice and  ileocecal valve. The                            colonoscopy was performed without difficulty. The                            patient tolerated the procedure well. The quality                            of the bowel preparation was poor. The ileocecal                            valve, appendiceal orifice, and rectum were                            photographed. Scope In: 10:55:16 AM Scope Out: 11:03:46 AM Scope Withdrawal Time: 0 hours 5 minutes 26 seconds  Total Procedure Duration: 0 hours 8 minutes 30 seconds  Findings:                 There was solid stool in the right colon,                            precluding adequate visualization.                           The exam was otherwise without abnormality on                            direct and retroflexion views. Complications:            No immediate complications. Estimated blood loss:                            None. Estimated Blood Loss:     Estimated blood loss: none. Impression:               - Preparation of the colon was poor, especially in                            the right colon                           -  The examination was otherwise normal on direct                            and retroflexion views. Recommendation:           - Patient has a contact number available for                            emergencies. The signs and symptoms of potential                            delayed complications were discussed with the                            patient. Return to normal activities tomorrow.                            Written discharge instructions were provided to the                            patient.                           - Resume previous diet.                           - Continue present medications.                           - Repeat colonoscopy at the next available                            appointment because the bowel preparation was poor                            (double prep protocol). Rachael Fee,  MD 09/24/2017 11:08:30 AM This report has been signed electronically.

## 2017-09-24 NOTE — Progress Notes (Signed)
Pt's states no medical or surgical changes since previsit or office visit. 

## 2017-09-24 NOTE — Patient Instructions (Signed)
Scheduled new colonoscopy and pre visit due to poor prep.  YOU HAD AN ENDOSCOPIC PROCEDURE TODAY AT THE [facility]:   Refer to the procedure report that was given to you for any specific questions about what was found during the examination.  If the procedure report does not answer your questions, please call your gastroenterologist to clarify.  If you requested that your care partner not be given the details of your procedure findings, then the procedure report has been included in a sealed envelope for you to review at your convenience later.  YOU SHOULD EXPECT: Some feelings of bloating in the abdomen. Passage of more gas than usual.  Walking can help get rid of the air that was put into your GI tract during the procedure and reduce the bloating. If you had a lower endoscopy (such as a colonoscopy or flexible sigmoidoscopy) you may notice spotting of blood in your stool or on the toilet paper. If you underwent a bowel prep for your procedure, you may not have a normal bowel movement for a few days.  Please Note:  You might notice some irritation and congestion in your nose or some drainage.  This is from the oxygen used during your procedure.  There is no need for concern and it should clear up in a day or so.  SYMPTOMS TO REPORT IMMEDIATELY:   Following lower endoscopy (colonoscopy or flexible sigmoidoscopy):  Excessive amounts of blood in the stool  Significant tenderness or worsening of abdominal pains  Swelling of the abdomen that is new, acute  Fever of 100F or higher  F For urgent or emergent issues, a gastroenterologist can be reached at any hour by calling [phone].   DIET:  We do recommend a small meal at first, but then you may proceed to your regular diet.  Drink plenty of fluids but you should avoid alcoholic beverages for 24 hours.  ACTIVITY:  You should plan to take it easy for the rest of today and you should NOT DRIVE or use heavy machinery until  tomorrow (because of the sedation medicines used during the test).    FOLLOW UP: Our staff will call the number listed on your records the next business day following your procedure to check on you and address any questions or concerns that you may have regarding the information given to you following your procedure. If we do not reach you, we will leave a message.  However, if you are feeling well and you are not experiencing any problems, there is no need to return our call.  We will assume that you have returned to your regular daily activities without incident.  If any biopsies were taken you will be contacted by phone or by letter within the next 1-3 weeks.  Please call us at [phone] if you have not heard about the biopsies in 3 weeks.    SIGNATURES/CONFIDENTIALITY: You and/or your care partner have signed paperwork which will be entered into your electronic medical record.  These signatures attest to the fact that that the information above on your After Visit Summary has been reviewed and is understood.  Full responsibility of the confidentiality of this discharge information lies with you and/or your care-partner.

## 2017-09-25 ENCOUNTER — Other Ambulatory Visit: Payer: Self-pay | Admitting: Family Medicine

## 2017-09-25 ENCOUNTER — Telehealth: Payer: Self-pay

## 2017-09-25 NOTE — Telephone Encounter (Signed)
  Follow up Call-  Call back number 09/24/2017  Post procedure Call Back phone  # [phone]  Permission to leave phone message Yes  Some recent data might be hidden     Patient questions:  Do you have a fever, pain , or abdominal swelling? No. Pain Score  0 *  Have you tolerated food without any problems? Yes.    Have you been able to return to your normal activities? Yes.    Do you have any questions about your discharge instructions: Diet   No. Medications  No. Follow up visit  No.  Do you have questions or concerns about your Care? No.  Actions: * If pain score is 4 or above: No action needed, pain <4.

## 2017-09-25 NOTE — Telephone Encounter (Signed)
Refill request for Hctz 25 mg #30 with 1 refill.  Pt needs appt to f/u on htn.  Will send to scheduling pool to schedule f/u htn appt with stallings. Dgaddy, CMA

## 2017-09-25 NOTE — Telephone Encounter (Signed)
Left message

## 2017-11-05 ENCOUNTER — Telehealth: Payer: Self-pay | Admitting: *Deleted

## 2017-11-05 NOTE — Telephone Encounter (Signed)
Pt. No showed for her PV appointment this a.m. At 830.  Called patient and no answer.  Left generic message on voicemail for patient to call office to reschedule her PV.  I will cancel both PV and colonoscopy if I do not hear back from her by 5 pm today.  B.Benitez, CMA  PV

## 2017-11-13 ENCOUNTER — Encounter: Payer: BLUE CROSS/BLUE SHIELD | Admitting: Gastroenterology

## 2017-12-20 ENCOUNTER — Other Ambulatory Visit: Payer: Self-pay | Admitting: Family Medicine

## 2017-12-20 NOTE — Telephone Encounter (Signed)
Hydrodiuril 25 mg refill request  LOV 11/21/16 with Dr. Creta Levin where this was addressed. ( Had acute visits for shoulder pain)  [facility] [address] [place] [place] [facility] [address] [facility] [address]

## 2018-01-24 ENCOUNTER — Other Ambulatory Visit: Payer: Self-pay | Admitting: Family Medicine
